# Patient Record
Sex: Female | Born: 1965 | Race: White | Hispanic: No | Marital: Married | Smoking: Never smoker
Health system: Southern US, Community
[De-identification: ages and names within clinical notes are randomized; demographics above are authoritative.]

## PROBLEM LIST (undated history)

## (undated) DIAGNOSIS — K219 Gastro-esophageal reflux disease without esophagitis: Secondary | ICD-10-CM

## (undated) DIAGNOSIS — D649 Anemia, unspecified: Secondary | ICD-10-CM

## (undated) DIAGNOSIS — N939 Abnormal uterine and vaginal bleeding, unspecified: Secondary | ICD-10-CM

## (undated) DIAGNOSIS — R55 Syncope and collapse: Secondary | ICD-10-CM

## (undated) DIAGNOSIS — M542 Cervicalgia: Secondary | ICD-10-CM

## (undated) HISTORY — PX: ABLATION: SHX5711

## (undated) HISTORY — PX: WISDOM TOOTH EXTRACTION: SHX21

## (undated) HISTORY — DX: Abnormal uterine and vaginal bleeding, unspecified: N93.9

## (undated) HISTORY — DX: Syncope and collapse: R55

## (undated) HISTORY — DX: Cervicalgia: M54.2

---

## 2011-09-01 ENCOUNTER — Ambulatory Visit: Payer: Self-pay | Admitting: Family Medicine

## 2014-10-30 LAB — HM PAP SMEAR: HM Pap smear: NEGATIVE

## 2016-03-03 ENCOUNTER — Encounter: Payer: Self-pay | Admitting: Family Medicine

## 2016-03-03 ENCOUNTER — Ambulatory Visit (INDEPENDENT_AMBULATORY_CARE_PROVIDER_SITE_OTHER): Payer: BC Managed Care – PPO | Admitting: Family Medicine

## 2016-03-03 VITALS — BP 134/83 | HR 77 | Temp 97.5°F | Ht 63.5 in | Wt 163.0 lb

## 2016-03-03 DIAGNOSIS — M542 Cervicalgia: Secondary | ICD-10-CM | POA: Diagnosis not present

## 2016-03-03 DIAGNOSIS — Z113 Encounter for screening for infections with a predominantly sexual mode of transmission: Secondary | ICD-10-CM

## 2016-03-03 MED ORDER — IBUPROFEN 600 MG PO TABS: 600.0000 mg | ORAL_TABLET | Freq: Three times a day (TID) | ORAL | Status: DC | PRN

## 2016-03-03 MED ORDER — CYCLOBENZAPRINE HCL 10 MG PO TABS: 10.0000 mg | ORAL_TABLET | Freq: Every day | ORAL | Status: DC

## 2016-03-03 NOTE — Patient Instructions (Addendum)
Cervical Strain and Sprain With Rehab Cervical strain and sprain are injuries that commonly occur with "whiplash" injuries. Whiplash occurs when the neck is forcefully whipped backward or forward, such as during a motor vehicle accident or during contact sports. The muscles, ligaments, tendons, discs, and nerves of the neck are susceptible to injury when this occurs. RISK FACTORS Risk of having a whiplash injury increases if:  Osteoarthritis of the spine.  Situations that make head or neck accidents or trauma more likely.  High-risk sports (football, rugby, wrestling, hockey, auto racing, gymnastics, diving, contact karate, or boxing).  Poor strength and flexibility of the neck.  Previous neck injury.  Poor tackling technique.  Improperly fitted or padded equipment. SYMPTOMS   Pain or stiffness in the front or back of neck or both.  Symptoms may present immediately or up to 24 hours after injury.  Dizziness, headache, nausea, and vomiting.  Muscle spasm with soreness and stiffness in the neck.  Tenderness and swelling at the injury site. PREVENTION  Learn and use proper technique (avoid tackling with the head, spearing, and head-butting; use proper falling techniques to avoid landing on the head).  Warm up and stretch properly before activity.  Maintain physical fitness:  Strength, flexibility, and endurance.  Cardiovascular fitness.  Wear properly fitted and padded protective equipment, such as padded soft collars, for participation in contact sports. PROGNOSIS  Recovery from cervical strain and sprain injuries is dependent on the extent of the injury. These injuries are usually curable in 1 week to 3 months with appropriate treatment.  RELATED COMPLICATIONS   Temporary numbness and weakness may occur if the nerve roots are damaged, and this may persist until the nerve has completely healed.  Chronic pain due to frequent recurrence of symptoms.  Prolonged healing,  especially if activity is resumed too soon (before complete recovery). TREATMENT  Treatment initially involves the use of ice and medication to help reduce pain and inflammation. It is also important to perform strengthening and stretching exercises and modify activities that worsen symptoms so the injury does not get worse. These exercises may be performed at home or with a therapist. For patients who experience severe symptoms, a soft, padded collar may be recommended to be worn around the neck.  Improving your posture may help reduce symptoms. Posture improvement includes pulling your chin and abdomen in while sitting or standing. If you are sitting, sit in a firm chair with your buttocks against the back of the chair. While sleeping, try replacing your pillow with a small towel rolled to 2 inches in diameter, or use a cervical pillow or soft cervical collar. Poor sleeping positions delay healing.  For patients with nerve root damage, which causes numbness or weakness, the use of a cervical traction apparatus may be recommended. Surgery is rarely necessary for these injuries. However, cervical strain and sprains that are present at birth (congenital) may require surgery. MEDICATION   If pain medication is necessary, nonsteroidal anti-inflammatory medications, such as aspirin and ibuprofen, or other minor pain relievers, such as acetaminophen, are often recommended.  Do not take pain medication for 7 days before surgery.  Prescription pain relievers may be given if deemed necessary by your caregiver. Use only as directed and only as much as you need. HEAT AND COLD:   Cold treatment (icing) relieves pain and reduces inflammation. Cold treatment should be applied for 10 to 15 minutes every 2 to 3 hours for inflammation and pain and immediately after any activity that aggravates your  symptoms. Use ice packs or an ice massage.  Heat treatment may be used prior to performing the stretching and  strengthening activities prescribed by your caregiver, physical therapist, or athletic trainer. Use a heat pack or a warm soak. SEEK MEDICAL CARE IF:   Symptoms get worse or do not improve in 2 weeks despite treatment.  New, unexplained symptoms develop (drugs used in treatment may produce side effects). EXERCISES RANGE OF MOTION (ROM) AND STRETCHING EXERCISES - Cervical Strain and Sprain These exercises may help you when beginning to rehabilitate your injury. In order to successfully resolve your symptoms, you must improve your posture. These exercises are designed to help reduce the forward-head and rounded-shoulder posture which contributes to this condition. Your symptoms may resolve with or without further involvement from your physician, physical therapist or athletic trainer. While completing these exercises, remember:   Restoring tissue flexibility helps normal motion to return to the joints. This allows healthier, less painful movement and activity.  An effective stretch should be held for at least 20 seconds, although you may need to begin with shorter hold times for comfort.  A stretch should never be painful. You should only feel a gentle lengthening or release in the stretched tissue. STRETCH- Axial Extensors  Lie on your back on the floor. You may bend your knees for comfort. Place a rolled-up hand towel or dish towel, about 2 inches in diameter, under the part of your head that makes contact with the floor.  Gently tuck your chin, as if trying to make a "double chin," until you feel a gentle stretch at the base of your head.  Hold _____30_____ seconds. Repeat ____2______ times. Complete this exercise ____2 ______ times per day.  STRETCH - Axial Extension   Stand or sit on a firm surface. Assume a good posture: chest up, shoulders drawn back, abdominal muscles slightly tense, knees unlocked (if standing) and feet hip width apart.  Slowly retract your chin so your head slides  back and your chin slightly lowers. Continue to look straight ahead.  You should feel a gentle stretch in the back of your head. Be certain not to feel an aggressive stretch since this can cause headaches later.  Hold for _____30_____ seconds. Repeat ____2______ times. Complete this exercise ____2______ times per day. STRETCH - Cervical Side Bend   Stand or sit on a firm surface. Assume a good posture: chest up, shoulders drawn back, abdominal muscles slightly tense, knees unlocked (if standing) and feet hip width apart.  Without letting your nose or shoulders move, slowly tip your right / left ear to your shoulder until your feel a gentle stretch in the muscles on the opposite side of your neck.  Hold ______30____ seconds. Repeat ____2______ times. Complete this exercise ____2______ times per day. STRETCH - Cervical Rotators   Stand or sit on a firm surface. Assume a good posture: chest up, shoulders drawn back, abdominal muscles slightly tense, knees unlocked (if standing) and feet hip width apart.  Keeping your eyes level with the ground, slowly turn your head until you feel a gentle stretch along the back and opposite side of your neck.  Hold ____30______ seconds. Repeat ___2_______ times. Complete this exercise ____2______ times per day. RANGE OF MOTION - Neck Circles   Stand or sit on a firm surface. Assume a good posture: chest up, shoulders drawn back, abdominal muscles slightly tense, knees unlocked (if standing) and feet hip width apart.  Gently roll your head down and around from the  back of one shoulder to the back of the other. The motion should never be forced or painful.  Repeat the motion 10-20 times, or until you feel the neck muscles relax and loosen. Repeat _____2_____ times. Complete the exercise _____2_____ times per day. STRENGTHENING EXERCISES - Cervical Strain and Sprain These exercises may help you when beginning to rehabilitate your injury. They may resolve  your symptoms with or without further involvement from your physician, physical therapist, or athletic trainer. While completing these exercises, remember:   Muscles can gain both the endurance and the strength needed for everyday activities through controlled exercises.  Complete these exercises as instructed by your physician, physical therapist, or athletic trainer. Progress the resistance and repetitions only as guided.  You may experience muscle soreness or fatigue, but the pain or discomfort you are trying to eliminate should never worsen during these exercises. If this pain does worsen, stop and make certain you are following the directions exactly. If the pain is still present after adjustments, discontinue the exercise until you can discuss the trouble with your clinician. STRENGTH - Cervical Flexors, Isometric  Face a wall, standing about 6 inches away. Place a small pillow, a ball about 6-8 inches in diameter, or a folded towel between your forehead and the wall.  Slightly tuck your chin and gently push your forehead into the soft object. Push only with mild to moderate intensity, building up tension gradually. Keep your jaw and forehead relaxed.  Hold 10 to 20 seconds. Keep your breathing relaxed.  Release the tension slowly. Relax your neck muscles completely before you start the next repetition. Repeat _____2_____ times. Complete this exercise _____2_____ times per day. STRENGTH- Cervical Lateral Flexors, Isometric   Stand about 6 inches away from a wall. Place a small pillow, a ball about 6-8 inches in diameter, or a folded towel between the side of your head and the wall.  Slightly tuck your chin and gently tilt your head into the soft object. Push only with mild to moderate intensity, building up tension gradually. Keep your jaw and forehead relaxed.  Hold 10 to 20 seconds. Keep your breathing relaxed.  Release the tension slowly. Relax your neck muscles completely before  you start the next repetition. Repeat ______2____ times. Complete this exercise _____2_____ times per day. STRENGTH - Cervical Extensors, Isometric   Stand about 6 inches away from a wall. Place a small pillow, a ball about 6-8 inches in diameter, or a folded towel between the back of your head and the wall.  Slightly tuck your chin and gently tilt your head back into the soft object. Push only with mild to moderate intensity, building up tension gradually. Keep your jaw and forehead relaxed.  Hold 10 to 20 seconds. Keep your breathing relaxed.  Release the tension slowly. Relax your neck muscles completely before you start the next repetition. Repeat _____2_____ times. Complete this exercise _____2_____ times per day. POSTURE AND BODY MECHANICS CONSIDERATIONS - Cervical Strain and Sprain Keeping correct posture when sitting, standing or completing your activities will reduce the stress put on different body tissues, allowing injured tissues a chance to heal and limiting painful experiences. The following are general guidelines for improved posture. Your physician or physical therapist will provide you with any instructions specific to your needs. While reading these guidelines, remember:  The exercises prescribed by your provider will help you have the flexibility and strength to maintain correct postures.  The correct posture provides the optimal environment for your joints to  work. All of your joints have less wear and tear when properly supported by a spine with good posture. This means you will experience a healthier, less painful body.  Correct posture must be practiced with all of your activities, especially prolonged sitting and standing. Correct posture is as important when doing repetitive low-stress activities (typing) as it is when doing a single heavy-load activity (lifting). PROLONGED STANDING WHILE SLIGHTLY LEANING FORWARD When completing a task that requires you to lean forward  while standing in one place for a long time, place either foot up on a stationary 2- to 4-inch high object to help maintain the best posture. When both feet are on the ground, the low back tends to lose its slight inward curve. If this curve flattens (or becomes too large), then the back and your other joints will experience too much stress, fatigue more quickly, and can cause pain.  RESTING POSITIONS Consider which positions are most painful for you when choosing a resting position. If you have pain with flexion-based activities (sitting, bending, stooping, squatting), choose a position that allows you to rest in a less flexed posture. You would want to avoid curling into a fetal position on your side. If your pain worsens with extension-based activities (prolonged standing, working overhead), avoid resting in an extended position such as sleeping on your stomach. Most people will find more comfort when they rest with their spine in a more neutral position, neither too rounded nor too arched. Lying on a non-sagging bed on your side with a pillow between your knees, or on your back with a pillow under your knees will often provide some relief. Keep in mind, being in any one position for a prolonged period of time, no matter how correct your posture, can still lead to stiffness. WALKING Walk with an upright posture. Your ears, shoulders, and hips should all line up. OFFICE WORK When working at a desk, create an environment that supports good, upright posture. Without extra support, muscles fatigue and lead to excessive strain on joints and other tissues. CHAIR:  A chair should be able to slide under your desk when your back makes contact with the back of the chair. This allows you to work closely.  The chair's height should allow your eyes to be level with the upper part of your monitor and your hands to be slightly lower than your elbows.  Body position:  Your feet should make contact with the floor.  If this is not possible, use a foot rest.  Keep your ears over your shoulders. This will reduce stress on your neck and low back.   This information is not intended to replace advice given to you by your health care provider. Make sure you discuss any questions you have with your health care provider.   Document Released: 11/23/2005 Document Revised: 12/14/2014 Document Reviewed: 03/07/2009 Elsevier Interactive Patient Education [employer].

## 2016-03-03 NOTE — Progress Notes (Signed)
BP 134/83 mmHg  Pulse 77  Temp(Src) 97.5 F (36.4 C)  Ht 5' 3.5" (1.613 m)  Wt 163 lb (73.936 kg)  BMI 28.42 kg/m2  SpO2 100%  LMP 02/26/2016 (Approximate)   Subjective:    Patient ID: Julie Patel, female    DOB: 21-May-1966, 50 y.o.   MRN: [id number]  HPI: Julie Patel is a 50 y.o. female  Chief Complaint  Patient presents with  . Neck Pain    X 1 week, no known injury   NECK PAIN- started about a week ago, has seen chiropractor 2x, she notes that it is a dull aches and an aching gradual on the whole neck, severe. She has been taking advil, which hasn't helped. It is worse with looking over her shoulder She has never had anything like this before. She went to see chiropractor and had an adjustment x1. She is not sure how she liked it. Has been feeling better since her adjustment Status: fluctuating Treatments attempted: chiropractor, rest and ibuprofen, heat and ice Relief with NSAIDs?:  significant Location: whole neck in the strap muscles Duration: 1 week Severity: severe Quality: tingling, burning, throbbing, depends Frequency: constant Radiation: headache Aggravating factors: moving her head around Alleviating factors: advil, chiropractry Weakness:  no Paresthesias / decreased sensation:  no  Fevers:  no  Relevant past medical, surgical, family and social history reviewed and updated as indicated. Interim medical history since our last visit reviewed. Allergies and medications reviewed and updated.  Review of Systems  Constitutional: Negative.   Respiratory: Negative.   Cardiovascular: Negative.   Musculoskeletal: Positive for myalgias, neck pain and neck stiffness. Negative for back pain, joint swelling, arthralgias and gait problem.  Psychiatric/Behavioral: Negative.     Per HPI unless specifically indicated above     Objective:    BP 134/83 mmHg  Pulse 77  Temp(Src) 97.5 F (36.4 C)  Ht 5' 3.5" (1.613 m)  Wt 163 lb (73.936 kg)  BMI 28.42  kg/m2  SpO2 100%  LMP 02/26/2016 (Approximate)  Wt Readings from Last 3 Encounters:  03/03/16 163 lb (73.936 kg)  10/30/14 160 lb (72.576 kg)    Physical Exam  Constitutional: She is oriented to person, place, and time. She appears well-developed and well-nourished. No distress.  HENT:  Head: Normocephalic and atraumatic.  Right Ear: Hearing normal.  Left Ear: Hearing normal.  Nose: Nose normal.  Eyes: Conjunctivae and lids are normal. Right eye exhibits no discharge. Left eye exhibits no discharge. No scleral icterus.  Cardiovascular: Normal rate, regular rhythm, normal heart sounds and intact distal pulses.  Exam reveals no gallop and no friction rub.   No murmur heard. Pulmonary/Chest: Effort normal and breath sounds normal. No respiratory distress. She has no wheezes. She has no rales. She exhibits no tenderness.  Neurological: She is alert and oriented to person, place, and time.  Skin: Skin is warm, dry and intact. No rash noted. No erythema. No pallor.  Psychiatric: She has a normal mood and affect. Her speech is normal and behavior is normal. Judgment and thought content normal. Cognition and memory are normal.  Nursing note and vitals reviewed. Neck Exam:    Tenderness to Palpation: yes    Midline cervical spine: no    Paraspinal neck musculature: yes    Trapezius: yes    Sternocleidomastoid: yes     Range of Motion:     Flexion: Decreased    Extension: Decreased    Lateral rotation: Decreased    Lateral  bending: Decreased     Neuro Examination: Upper extremity DTRs normal & symmetric.  Strength and sensation intact.      Special Tests:     Spurling test: negative   Results for orders placed or performed in visit on 03/03/16  HM PAP SMEAR  Result Value Ref Range   HM Pap smear Ascus, Negative HPV       Assessment & Plan:   Problem List Items Addressed This Visit    None    Visit Diagnoses    Neck pain    -  Primary    Seems to be due to muscle spasm. Will  start exercises, ibuprofen and flexeril. Recheck 2 weeks, if not better, consider PT. Continue heat.    Screening for STD (sexually transmitted disease)        Drawn today. Await results.     Relevant Orders    HIV antibody        Follow up plan: Return in about 2 weeks (around 03/17/2016) for follow up neck.

## 2016-03-04 ENCOUNTER — Encounter: Payer: Self-pay | Admitting: Family Medicine

## 2016-03-04 LAB — HIV ANTIBODY (ROUTINE TESTING W REFLEX): HIV Screen 4th Generation wRfx: NONREACTIVE

## 2016-03-17 ENCOUNTER — Encounter: Payer: Self-pay | Admitting: Family Medicine

## 2016-03-17 ENCOUNTER — Ambulatory Visit (INDEPENDENT_AMBULATORY_CARE_PROVIDER_SITE_OTHER): Payer: BC Managed Care – PPO | Admitting: Family Medicine

## 2016-03-17 VITALS — BP 130/84 | HR 77 | Temp 98.9°F | Ht 64.3 in | Wt 161.0 lb

## 2016-03-17 DIAGNOSIS — M542 Cervicalgia: Secondary | ICD-10-CM

## 2016-03-17 DIAGNOSIS — M722 Plantar fascial fibromatosis: Secondary | ICD-10-CM

## 2016-03-17 DIAGNOSIS — R413 Other amnesia: Secondary | ICD-10-CM | POA: Diagnosis not present

## 2016-03-17 NOTE — Patient Instructions (Signed)

## 2016-03-17 NOTE — Progress Notes (Signed)
BP 130/84 mmHg  Pulse 77  Temp(Src) 98.9 F (37.2 C)  Ht 5' 4.3" (1.633 m)  Wt 161 lb (73.029 kg)  BMI 27.39 kg/m2  SpO2 97%  LMP 02/26/2016 (Approximate)   Subjective:    Patient ID: Julie Patel, female    DOB: 11-Nov-1966, 50 y.o.   MRN: [id number]  HPI: Julie Patel is a 50 y.o. female  Chief Complaint  Patient presents with  . Neck Pain    Patient states that her neck feels a lot better  . 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 She is just concerned about the symothoms she has had   Neck feeling much better. Not entirely gone. Now noticing that it's a little different. Occasionally pops. Hurts when she does a lot of work or does things that she is not used to doing.   FOOT PAIN Duration: since last summer Involved foot: right Mechanism of injury: unknown Location: bottom of her foot Onset: gradual  Severity: moderate  Quality:  pressure-like and stabbing Frequency: intermittent Radiation: yes, into the back of her leg Aggravating factors: first step of the day  Alleviating factors: nothing  Status: worse Treatments attempted: rest  Relief with NSAIDs?:  No NSAIDs Taken Weakness with weight bearing or walking: no Morning stiffness: yes Swelling: no Redness: no Bruising: no Paresthesias / decreased sensation: no  Fevers:no  Concerned about her memory. Notes that her vocabulary has decreased. Has some word finding issues. Notes that it was associated with her cycle, but now harder to link as the cycle is less normal. Has some trouble with remembering phone calls and other things like that. Thinks that it may be increased stress with all the activities that she has to make sure she is monitor and take care of. Her mom has alzheimer's. She is not sure how old her mom was when she started to have issues.     Relevant past medical, surgical, family and social history reviewed and updated as indicated. Interim medical history since our last visit reviewed. Allergies and medications reviewed and updated.  Review of Systems  Constitutional: Negative.   Respiratory: Negative.   Cardiovascular: Negative.   Musculoskeletal: Positive for myalgias. Negative for back pain, joint swelling, arthralgias, gait problem, neck pain and neck stiffness.  Psychiatric/Behavioral: Negative for suicidal ideas, hallucinations, behavioral problems, confusion, sleep disturbance, self-injury, dysphoric mood, decreased concentration and agitation. The patient is not nervous/anxious and is not hyperactive.     Per HPI unless specifically indicated above     Objective:    BP 130/84 mmHg  Pulse 77  Temp(Src) 98.9 F (37.2 C)  Ht 5' 4.3" (1.633 m)  Wt 161 lb (73.029 kg)  BMI 27.39 kg/m2  SpO2 97%  LMP 02/26/2016 (Approximate)  Wt Readings from Last 3 Encounters:  03/17/16 161 lb (73.029 kg)  03/03/16 163 lb (73.936 kg)  10/30/14 160 lb (72.576 kg)    Physical Exam  Constitutional: She is oriented to person, place, and time. She appears well-developed and well-nourished. No distress.  HENT:  Head: Normocephalic and atraumatic.  Right Ear: Hearing and external ear normal.  Left Ear: Hearing and external ear normal.  Nose: Nose normal.  Eyes: Conjunctivae, EOM and lids are normal. Pupils are equal, round, and reactive to light. Right eye exhibits no discharge. Left eye exhibits  no discharge. No scleral icterus.  Neck: Normal range of motion. Neck supple. No JVD present. No tracheal deviation present. No thyromegaly present.  Pulmonary/Chest: Effort normal. No stridor. No respiratory distress.  Musculoskeletal: Normal range of motion. She exhibits tenderness (to the plantar fascia on the R side).  Lymphadenopathy:    She has no cervical adenopathy.  Neurological: She is alert and oriented to person, place,  and time.  Skin: Skin is warm, dry and intact. No rash noted. She is not diaphoretic. No erythema. No pallor.  Psychiatric: She has a normal mood and affect. Her speech is normal and behavior is normal. Judgment and thought content normal. Cognition and memory are normal.    Results for orders placed or performed in visit on 03/17/16  CBC with Differential/Platelet  Result Value Ref Range   WBC 6.6 3.4 - 10.8 x10E3/uL   RBC 4.74 3.77 - 5.28 x10E6/uL   Hemoglobin 13.4 11.1 - 15.9 g/dL   Hematocrit 39.9 34.0 - 46.6 %   MCV 84 79 - 97 fL   MCH 28.3 26.6 - 33.0 pg   MCHC 33.6 31.5 - 35.7 g/dL   RDW 14.0 12.3 - 15.4 %   Platelets 257 150 - 379 x10E3/uL   Neutrophils 64 %   Lymphs 28 %   Monocytes 6 %   Eos 2 %   Basos 0 %   Neutrophils Absolute 4.2 1.4 - 7.0 x10E3/uL   Lymphocytes Absolute 1.9 0.7 - 3.1 x10E3/uL   Monocytes Absolute 0.4 0.1 - 0.9 x10E3/uL   EOS (ABSOLUTE) 0.1 0.0 - 0.4 x10E3/uL   Basophils Absolute 0.0 0.0 - 0.2 x10E3/uL   Immature Granulocytes 0 %   Immature Grans (Abs) 0.0 0.0 - 0.1 x10E3/uL  Thyroid Panel With TSH  Result Value Ref Range   TSH 1.660 0.450 - 4.500 uIU/mL   T4, Total 8.2 4.5 - 12.0 ug/dL   T3 Uptake Ratio 23 (L) 24 - 39 %   Free Thyroxine Index 1.9 1.2 - 4.9  VITAMIN D 25 Hydroxy (Vit-D Deficiency, Fractures)  Result Value Ref Range   Vit D, 25-Hydroxy 45.8 30.0 - 100.0 ng/mL  B12 and Folate Panel  Result Value Ref Range   Vitamin B-12 706 211 - 946 pg/mL   Folate 15.0 >3.0 ng/mL  Comprehensive metabolic panel  Result Value Ref Range   Glucose 86 65 - 99 mg/dL   BUN 13 6 - 24 mg/dL   Creatinine, Ser 0.92 0.57 - 1.00 mg/dL   GFR calc non Af Amer 74 >59 mL/min/1.73   GFR calc Af Amer 86 >59 mL/min/1.73   BUN/Creatinine Ratio 14 9 - 23   Sodium 140 134 - 144 mmol/L   Potassium 4.8 3.5 - 5.2 mmol/L   Chloride 100 96 - 106 mmol/L   CO2 24 18 - 29 mmol/L   Calcium 9.5 8.7 - 10.2 mg/dL   Total Protein 7.3 6.0 - 8.5 g/dL   Albumin 4.5 3.5  - 5.5 g/dL   Globulin, Total 2.8 1.5 - 4.5 g/dL   Albumin/Globulin Ratio 1.6 1.2 - 2.2   Bilirubin Total 0.8 0.0 - 1.2 mg/dL   Alkaline Phosphatase 66 39 - 117 IU/L   AST 19 0 - 40 IU/L   ALT 13 0 - 32 IU/L      Assessment & Plan:   Problem List Items Addressed This Visit    None    Visit Diagnoses    Plantar fasciitis of right foot    -  Primary    Brace Rx given. Work on stretches, information given, discussed freezing a water bottle. Let us know if not getting better or getting worse.     Neck pain        Resolved. Continue exercises. Call if getting worse again.     Memory loss        MMSE 30 today. Likely stress and too much on her plate. Will check labs.Continue to monitor, let us know if getting worse and will consider neurology referral.     Relevant Orders    CBC with Differential/Platelet (Completed)    Thyroid Panel With TSH (Completed)    VITAMIN D 25 Hydroxy (Vit-D Deficiency, Fractures) (Completed)    B12 and Folate Panel (Completed)    Comprehensive metabolic panel (Completed)        Follow up plan: Return if symptoms worsen or fail to improve.

## 2016-03-18 ENCOUNTER — Encounter: Payer: Self-pay | Admitting: Family Medicine

## 2016-03-18 LAB — COMPREHENSIVE METABOLIC PANEL
A/G RATIO: 1.6 (ref 1.2–2.2)
ALK PHOS: 66 IU/L (ref 39–117)
ALT: 13 IU/L (ref 0–32)
AST: 19 IU/L (ref 0–40)
Albumin: 4.5 g/dL (ref 3.5–5.5)
BILIRUBIN TOTAL: 0.8 mg/dL (ref 0.0–1.2)
BUN/Creatinine Ratio: 14 (ref 9–23)
BUN: 13 mg/dL (ref 6–24)
CO2: 24 mmol/L (ref 18–29)
Calcium: 9.5 mg/dL (ref 8.7–10.2)
Chloride: 100 mmol/L (ref 96–106)
Creatinine, Ser: 0.92 mg/dL (ref 0.57–1.00)
GFR calc Af Amer: 86 mL/min/{1.73_m2} (ref >59)
GFR, EST NON AFRICAN AMERICAN: 74 mL/min/{1.73_m2} (ref >59)
GLOBULIN, TOTAL: 2.8 g/dL (ref 1.5–4.5)
Glucose: 86 mg/dL (ref 65–99)
POTASSIUM: 4.8 mmol/L (ref 3.5–5.2)
SODIUM: 140 mmol/L (ref 134–144)
Total Protein: 7.3 g/dL (ref 6.0–8.5)

## 2016-03-18 LAB — CBC WITH DIFFERENTIAL/PLATELET
BASOS: 0 %
Basophils Absolute: 0 10*3/uL (ref 0.0–0.2)
EOS (ABSOLUTE): 0.1 10*3/uL (ref 0.0–0.4)
EOS: 2 %
HEMATOCRIT: 39.9 % (ref 34.0–46.6)
HEMOGLOBIN: 13.4 g/dL (ref 11.1–15.9)
IMMATURE GRANULOCYTES: 0 %
Immature Grans (Abs): 0 10*3/uL (ref 0.0–0.1)
Lymphocytes Absolute: 1.9 10*3/uL (ref 0.7–3.1)
Lymphs: 28 %
MCH: 28.3 pg (ref 26.6–33.0)
MCHC: 33.6 g/dL (ref 31.5–35.7)
MCV: 84 fL (ref 79–97)
MONOCYTES: 6 %
Monocytes Absolute: 0.4 10*3/uL (ref 0.1–0.9)
NEUTROS PCT: 64 %
Neutrophils Absolute: 4.2 10*3/uL (ref 1.4–7.0)
Platelets: 257 10*3/uL (ref 150–379)
RBC: 4.74 x10E6/uL (ref 3.77–5.28)
RDW: 14 % (ref 12.3–15.4)
WBC: 6.6 10*3/uL (ref 3.4–10.8)

## 2016-03-18 LAB — VITAMIN D 25 HYDROXY (VIT D DEFICIENCY, FRACTURES): VIT D 25 HYDROXY: 45.8 ng/mL (ref 30.0–100.0)

## 2016-03-18 LAB — THYROID PANEL WITH TSH
FREE THYROXINE INDEX: 1.9 (ref 1.2–4.9)
T3 UPTAKE RATIO: 23 % — AB (ref 24–39)
T4 TOTAL: 8.2 ug/dL (ref 4.5–12.0)
TSH: 1.66 u[IU]/mL (ref 0.450–4.500)

## 2016-03-18 LAB — B12 AND FOLATE PANEL
Folate: 15 ng/mL (ref >3.0)
VITAMIN B 12: 706 pg/mL (ref 211–946)

## 2016-05-25 ENCOUNTER — Ambulatory Visit (INDEPENDENT_AMBULATORY_CARE_PROVIDER_SITE_OTHER): Payer: BC Managed Care – PPO | Admitting: Family Medicine

## 2016-05-25 ENCOUNTER — Encounter: Payer: Self-pay | Admitting: Family Medicine

## 2016-05-25 VITALS — BP 120/75 | HR 71 | Temp 97.9°F | Ht 64.0 in | Wt 164.0 lb

## 2016-05-25 DIAGNOSIS — R413 Other amnesia: Secondary | ICD-10-CM

## 2016-05-25 DIAGNOSIS — M722 Plantar fascial fibromatosis: Secondary | ICD-10-CM | POA: Diagnosis not present

## 2016-05-25 NOTE — Progress Notes (Signed)
BP 120/75 mmHg  Pulse 71  Temp(Src) 97.9 F (36.6 C)  Ht 5\' 4"  (1.626 m)  Wt 164 lb (74.39 kg)  BMI 28.14 kg/m2  SpO2 98%  LMP  (LMP Unknown)   Subjective:    Patient ID: Julie Patel, female    DOB: [birth year], 50 y.o.   MRN: [id number]  HPI: Julie Patel is a 50 y.o. female  Chief Complaint  Patient presents with  . Foot Pain    right   . Memory Loss   Foot is doing so much better. Still has a little bit of pain but doing much better. She has been doing her stretches. She is feeling better. No other concerns about this.   Memory is still there. Hasn't had much time to worry about it. She is aware of it. Still having issues with word finding. Has been trying to make decisions about possible career change and going back to school and is very anxious about this. She is still very concerned about her memory given that her Mom has dementia. She is afraid that she is going to get it too. She would like to see neurology to discuss with them and would like to see someone other than who her Mom sees.   She is otherwise doing well with no other concerns or complaints at this time.   Relevant past medical, surgical, family and social history reviewed and updated as indicated. Interim medical history since our last visit reviewed. Allergies and medications reviewed and updated.  Review of Systems  Constitutional: Negative.   Respiratory: Negative.   Cardiovascular: Negative.   Psychiatric/Behavioral: Negative.     Per HPI unless specifically indicated above     Objective:    BP 120/75 mmHg  Pulse 71  Temp(Src) 97.9 F (36.6 C)  Ht 5\' 4"  (1.626 m)  Wt 164 lb (74.39 kg)  BMI 28.14 kg/m2  SpO2 98%  LMP  (LMP Unknown)  Wt Readings from Last 3 Encounters:  05/25/16 164 lb (74.39 kg)  03/17/16 161 lb (73.029 kg)  03/03/16 163 lb (73.936 kg)    Physical Exam  Constitutional: She is oriented to person, place, and time. She appears well-developed and well-nourished.  No distress.  HENT:  Head: Normocephalic and atraumatic.  Right Ear: Hearing normal.  Left Ear: Hearing normal.  Nose: Nose normal.  Eyes: Conjunctivae and lids are normal. Right eye exhibits no discharge. Left eye exhibits no discharge. No scleral icterus.  Cardiovascular: Normal rate, regular rhythm, normal heart sounds and intact distal pulses.  Exam reveals no gallop and no friction rub.   No murmur heard. Pulmonary/Chest: Effort normal and breath sounds normal. No respiratory distress. She has no wheezes. She has no rales. She exhibits no tenderness.  Musculoskeletal: Normal range of motion.  Neurological: She is alert and oriented to person, place, and time.  Skin: Skin is warm, dry and intact. No rash noted. No erythema. No pallor.  Psychiatric: Her speech is normal and behavior is normal. Judgment and thought content normal. Her mood appears anxious. Cognition and memory are normal.  Nursing note and vitals reviewed.   Results for orders placed or performed in visit on 03/17/16  CBC with Differential/Platelet  Result Value Ref Range   WBC 6.6 3.4 - 10.8 x10E3/uL   RBC 4.74 3.77 - 5.28 x10E6/uL   Hemoglobin 13.4 11.1 - 15.9 g/dL   Hematocrit 39.9 34.0 - 46.6 %   MCV 84 79 - 97 fL   MCH 28.3  26.6 - 33.0 pg   MCHC 33.6 31.5 - 35.7 g/dL   RDW 14.0 12.3 - 15.4 %   Platelets 257 150 - 379 x10E3/uL   Neutrophils 64 %   Lymphs 28 %   Monocytes 6 %   Eos 2 %   Basos 0 %   Neutrophils Absolute 4.2 1.4 - 7.0 x10E3/uL   Lymphocytes Absolute 1.9 0.7 - 3.1 x10E3/uL   Monocytes Absolute 0.4 0.1 - 0.9 x10E3/uL   EOS (ABSOLUTE) 0.1 0.0 - 0.4 x10E3/uL   Basophils Absolute 0.0 0.0 - 0.2 x10E3/uL   Immature Granulocytes 0 %   Immature Grans (Abs) 0.0 0.0 - 0.1 x10E3/uL  Thyroid Panel With TSH  Result Value Ref Range   TSH 1.660 0.450 - 4.500 uIU/mL   T4, Total 8.2 4.5 - 12.0 ug/dL   T3 Uptake Ratio 23 (L) 24 - 39 %   Free Thyroxine Index 1.9 1.2 - 4.9  VITAMIN D 25 Hydroxy (Vit-D  Deficiency, Fractures)  Result Value Ref Range   Vit D, 25-Hydroxy 45.8 30.0 - 100.0 ng/mL  B12 and Folate Panel  Result Value Ref Range   Vitamin B-12 706 211 - 946 pg/mL   Folate 15.0 >3.0 ng/mL  Comprehensive metabolic panel  Result Value Ref Range   Glucose 86 65 - 99 mg/dL   BUN 13 6 - 24 mg/dL   Creatinine, Ser 0.92 0.57 - 1.00 mg/dL   GFR calc non Af Amer 74 >59 mL/min/1.73   GFR calc Af Amer 86 >59 mL/min/1.73   BUN/Creatinine Ratio 14 9 - 23   Sodium 140 134 - 144 mmol/L   Potassium 4.8 3.5 - 5.2 mmol/L   Chloride 100 96 - 106 mmol/L   CO2 24 18 - 29 mmol/L   Calcium 9.5 8.7 - 10.2 mg/dL   Total Protein 7.3 6.0 - 8.5 g/dL   Albumin 4.5 3.5 - 5.5 g/dL   Globulin, Total 2.8 1.5 - 4.5 g/dL   Albumin/Globulin Ratio 1.6 1.2 - 2.2   Bilirubin Total 0.8 0.0 - 1.2 mg/dL   Alkaline Phosphatase 66 39 - 117 IU/L   AST 19 0 - 40 IU/L   ALT 13 0 - 32 IU/L      Assessment & Plan:   Problem List Items Addressed This Visit    None    Visit Diagnoses    Memory loss    -  Primary    Again likely due to stress, but she is very concerned. Labs normal last visit. Would like to see neurology. Referral generated today. MMSE 30 last visit.     Relevant Orders    Ambulatory referral to Neurology    Plantar fasciitis of right foot        Improved. Continue stretches. Call with concerns. Keep stretching during flip-flop season.        Follow up plan: Return in about 6 months (around 11/24/2016) for physical.

## 2016-06-29 ENCOUNTER — Ambulatory Visit: Payer: Self-pay | Admitting: Neurology

## 2016-09-14 ENCOUNTER — Ambulatory Visit (INDEPENDENT_AMBULATORY_CARE_PROVIDER_SITE_OTHER): Payer: BC Managed Care – PPO | Admitting: Family Medicine

## 2016-09-14 ENCOUNTER — Encounter: Payer: Self-pay | Admitting: Family Medicine

## 2016-09-14 VITALS — BP 120/82 | HR 70 | Temp 97.8°F | Wt 169.0 lb

## 2016-09-14 DIAGNOSIS — N390 Urinary tract infection, site not specified: Secondary | ICD-10-CM

## 2016-09-14 DIAGNOSIS — Z23 Encounter for immunization: Secondary | ICD-10-CM | POA: Diagnosis not present

## 2016-09-14 MED ORDER — PHENAZOPYRIDINE HCL 200 MG PO TABS: 200.0000 mg | ORAL_TABLET | Freq: Three times a day (TID) | ORAL | 0 refills | Status: DC | PRN

## 2016-09-14 MED ORDER — SULFAMETHOXAZOLE-TRIMETHOPRIM 800-160 MG PO TABS: 1.0000 | ORAL_TABLET | Freq: Two times a day (BID) | ORAL | 0 refills | Status: DC

## 2016-09-14 NOTE — Progress Notes (Signed)
   BP 120/82   Pulse 70   Temp 97.8 F (36.6 C)   Wt 169 lb (76.7 kg)   LMP  (LMP Unknown) Comment: very irregular  SpO2 100%   BMI 29.01 kg/m    Subjective:    Patient ID: Julie Patel, female    DOB: 1966/05/06, 50 y.o.   MRN: [id number]  HPI: Julie Patel is a 50 y.o. female  Chief Complaint  Patient presents with  . Urinary Tract Infection    started yesterday. hurts to pee, frequency.    Patient presents with a 1 day history of urinary frequency, urgency, and dysuria. Denies fever, chills, back pain, N/V. Has not tried anything OTC at this time. Has had sporadic UTIs in the past and states this feels consistent with how they felt so she wanted to come in right away.   Relevant past medical, surgical, family and social history reviewed and updated as indicated. Interim medical history since our last visit reviewed. Allergies and medications reviewed and updated.  Review of Systems  Constitutional: Negative.   HENT: Negative.   Respiratory: Negative.   Cardiovascular: Negative.   Gastrointestinal: Negative.   Genitourinary: Positive for dysuria, frequency and urgency.  Musculoskeletal: Negative.   Skin: Negative.   Neurological: Negative.   Psychiatric/Behavioral: Negative.     Per HPI unless specifically indicated above     Objective:    BP 120/82   Pulse 70   Temp 97.8 F (36.6 C)   Wt 169 lb (76.7 kg)   LMP  (LMP Unknown) Comment: very irregular  SpO2 100%   BMI 29.01 kg/m   Wt Readings from Last 3 Encounters:  09/14/16 169 lb (76.7 kg)  05/25/16 164 lb (74.4 kg)  03/17/16 161 lb (73 kg)    Physical Exam  Constitutional: She is oriented to person, place, and time. She appears well-developed and well-nourished. No distress.  HENT:  Head: Atraumatic.  Eyes: Conjunctivae are normal. No scleral icterus.  Neck: Normal range of motion. Neck supple.  Cardiovascular: Normal rate and normal heart sounds.   Pulmonary/Chest: Effort normal and  breath sounds normal.  Abdominal: Soft. Bowel sounds are normal. She exhibits no distension. There is no tenderness.  Musculoskeletal: Normal range of motion.  No CVA tenderness b/l  Neurological: She is alert and oriented to person, place, and time.  Skin: Skin is warm and dry.  Psychiatric: She has a normal mood and affect. Her behavior is normal.      Assessment & Plan:   Problem List Items Addressed This Visit    None    Visit Diagnoses    Acute lower UTI    -  Primary   Bactrim and AZO sent. Encouraged good water intake, cranberry juice, and a probiotic. Await cx results   Relevant Medications   sulfamethoxazole-trimethoprim (BACTRIM DS,SEPTRA DS) 800-160 MG tablet   phenazopyridine (PYRIDIUM) 200 MG tablet   Other Relevant Orders   UA/M w/rflx Culture, Routine (Completed)   Needs flu shot       Encounter for immunization       Relevant Orders   Flu Vaccine QUAD 36+ mos IM (Completed)       Follow up plan: Return if symptoms worsen or fail to improve.

## 2016-09-14 NOTE — Patient Instructions (Signed)
Follow up as needed

## 2016-09-16 LAB — MICROSCOPIC EXAMINATION

## 2016-09-16 LAB — UA/M W/RFLX CULTURE, ROUTINE
BILIRUBIN UA: NEGATIVE
Glucose, UA: NEGATIVE
KETONES UA: NEGATIVE
NITRITE UA: POSITIVE — AB
Specific Gravity, UA: 1.01 (ref 1.005–1.030)
UUROB: 0.2 mg/dL (ref 0.2–1.0)
pH, UA: 6 (ref 5.0–7.5)

## 2016-09-16 LAB — URINE CULTURE, REFLEX

## 2016-10-21 ENCOUNTER — Encounter: Payer: Self-pay | Admitting: Family Medicine

## 2016-10-21 ENCOUNTER — Ambulatory Visit (INDEPENDENT_AMBULATORY_CARE_PROVIDER_SITE_OTHER): Payer: BC Managed Care – PPO | Admitting: Family Medicine

## 2016-10-21 VITALS — BP 117/79 | HR 69 | Temp 97.9°F | Ht 65.5 in | Wt 169.2 lb

## 2016-10-21 DIAGNOSIS — N939 Abnormal uterine and vaginal bleeding, unspecified: Secondary | ICD-10-CM | POA: Diagnosis not present

## 2016-10-21 DIAGNOSIS — N898 Other specified noninflammatory disorders of vagina: Secondary | ICD-10-CM

## 2016-10-21 DIAGNOSIS — H9193 Unspecified hearing loss, bilateral: Secondary | ICD-10-CM | POA: Diagnosis not present

## 2016-10-21 LAB — UA/M W/RFLX CULTURE, ROUTINE
Bilirubin, UA: NEGATIVE
Glucose, UA: NEGATIVE
KETONES UA: NEGATIVE
Leukocytes, UA: NEGATIVE
NITRITE UA: NEGATIVE
PH UA: 7 (ref 5.0–7.5)
Protein, UA: NEGATIVE
RBC UA: NEGATIVE
SPEC GRAV UA: 1.02 (ref 1.005–1.030)
UUROB: 0.2 mg/dL (ref 0.2–1.0)

## 2016-10-21 MED ORDER — FLUCONAZOLE 150 MG PO TABS: 150.0000 mg | ORAL_TABLET | Freq: Once | ORAL | 0 refills | Status: AC

## 2016-10-21 NOTE — Progress Notes (Signed)
BP 117/79 (BP Location: Right Arm, Patient Position: Sitting, Cuff Size: Normal)   Pulse 69   Temp 97.9 F (36.6 C)   Ht 5' 5.5" (1.664 m)   Wt 169 lb 3.2 oz (76.7 kg)   LMP 09/25/2016 (Approximate)   SpO2 100%   BMI 27.73 kg/m    Subjective:    Patient ID: Julie Patel, female    DOB: 11-19-1966, 50 y.o.   MRN: [id number]  HPI: Julie Patel is a 50 y.o. female  Chief Complaint  Patient presents with  . Vaginal Redness    pt states she had her period for 3 weeks and ever since her vaginal skin has been red. 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    Patient presents with several of vaginal irritation and redness. Took 1 day of monistat with little relief. Has not noticed if she has had an abnormal discharge, unsure if she is having dysuria but does feel some irritation in her urethra. Prior to these sxs, she had a 3 week painless period which is abnormal for her. The bleeding was heavy, finally stopped about 3 days ago.  Denies possibility of STDs or pregnancy, states she has been abstinent for 5 years now. Does use Ivory soap to wash her private areas, states she has had intermittent irritation from this previously that resolved with use of baby soap.   Also states she has noticed that she doesn't hear as well as she used to anymore, would like eval by an audiologist. Julie Patel if worse in one ear or the other. No known trauma, prior infections, or other causes.   Relevant past medical, surgical, family and social history reviewed and updated as indicated. Interim medical history since our last visit reviewed. Allergies and medications reviewed and updated.  Review of Systems  Constitutional: Negative.   HENT: Negative.   Eyes: Negative.   Respiratory: Negative.   Cardiovascular: Negative.   Gastrointestinal: Negative.   Genitourinary: Positive for dysuria, menstrual problem and vaginal pain.  Musculoskeletal: Negative.   Skin: Negative.   Neurological: Negative.     Psychiatric/Behavioral: Negative.     Per HPI unless specifically indicated above     Objective:    BP 117/79 (BP Location: Right Arm, Patient Position: Sitting, Cuff Size: Normal)   Pulse 69   Temp 97.9 F (36.6 C)   Ht 5' 5.5" (1.664 m)   Wt 169 lb 3.2 oz (76.7 kg)   LMP 09/25/2016 (Approximate)   SpO2 100%   BMI 27.73 kg/m   Wt Readings from Last 3 Encounters:  10/21/16 169 lb 3.2 oz (76.7 kg)  09/14/16 169 lb (76.7 kg)  05/25/16 164 lb (74.4 kg)    Physical Exam  Constitutional: She is oriented to person, place, and time. She appears well-developed and well-nourished. No distress.  HENT:  Head: Atraumatic.  Eyes: Conjunctivae are normal. No scleral icterus.  Neck: Normal range of motion. Neck supple.  Cardiovascular: Normal rate.   Pulmonary/Chest: Effort normal. No respiratory distress.  Abdominal: Soft. Bowel sounds are normal. She exhibits no distension. There is no tenderness.  Genitourinary:  Genitourinary Comments: Monistat cream recently applied  Musculoskeletal: Normal range of motion.  Neurological: She is alert and oriented to person, place, and time.  Skin: Skin is warm and dry.  Psychiatric: She has a normal mood and affect. Her behavior is normal.  Nursing note and vitals reviewed.   Results for orders placed or performed in visit on 09/14/16  Microscopic Examination  Result Value  Ref Range   WBC, UA 11-30 (A) 0 - 5 /hpf   RBC, UA 11-30 (A) 0 - 2 /hpf   Epithelial Cells (non renal) 0-10 0 - 10 /hpf   Bacteria, UA Moderate (A) None seen/Few  UA/M w/rflx Culture, Routine  Result Value Ref Range   Specific Gravity, UA 1.010 1.005 - 1.030   pH, UA 6.0 5.0 - 7.5   Color, UA Yellow Yellow   Appearance Ur Hazy (A) Clear   Leukocytes, UA 3+ (A) Negative   Protein, UA 1+ (A) Negative/Trace   Glucose, UA Negative Negative   Ketones, UA Negative Negative   RBC, UA 3+ (A) Negative   Bilirubin, UA Negative Negative   Urobilinogen, Ur 0.2 0.2 - 1.0  mg/dL   Nitrite, UA Positive (A) Negative   Microscopic Examination See below:    Urinalysis Reflex Comment   Urine Culture, Routine  Result Value Ref Range   Urine Culture, Routine Final report (A)    Urine Culture result 1 Escherichia coli (A)    ANTIMICROBIAL SUSCEPTIBILITY Comment       Assessment & Plan:   Problem List Items Addressed This Visit    None    Visit Diagnoses    Abnormal uterine bleeding (AUB)    -  Primary   Continue to monitor, likely peri-menopausal origin   Relevant Orders   CBC with Differential/Platelet   TSH   Vaginal irritation       U/A negative for infection.    Relevant Orders   UA/M w/rflx Culture, Routine   Decreased hearing of both ears       Per pt request, ENT referral placed for evaluation   Relevant Orders   Ambulatory referral to ENT    Deferred wet prep today as she has been using monistat and currently has a layer of the cream on. Will send a diflucan tablet, continue rest of monistat. Start using hyaluronic acid to moisturize and a gentle unscented soap to wash. If no improvement in 1 week, follow up.    Follow up plan: Return if symptoms worsen or fail to improve.

## 2016-10-21 NOTE — Patient Instructions (Addendum)
Follow up as needed  Hyaluronic acid for moisture Unscented dove soap or baby wash Probiotic Finish monistat

## 2016-10-22 ENCOUNTER — Telehealth: Payer: Self-pay | Admitting: Family Medicine

## 2016-10-22 LAB — CBC WITH DIFFERENTIAL/PLATELET
BASOS: 0 %
Basophils Absolute: 0 10*3/uL (ref 0.0–0.2)
EOS (ABSOLUTE): 0.1 10*3/uL (ref 0.0–0.4)
EOS: 2 %
HEMATOCRIT: 32.5 % — AB (ref 34.0–46.6)
Hemoglobin: 10.6 g/dL — ABNORMAL LOW (ref 11.1–15.9)
IMMATURE GRANULOCYTES: 0 %
Immature Grans (Abs): 0 10*3/uL (ref 0.0–0.1)
Lymphocytes Absolute: 1.6 10*3/uL (ref 0.7–3.1)
Lymphs: 32 %
MCH: 28 pg (ref 26.6–33.0)
MCHC: 32.6 g/dL (ref 31.5–35.7)
MCV: 86 fL (ref 79–97)
MONOS ABS: 0.3 10*3/uL (ref 0.1–0.9)
Monocytes: 6 %
NEUTROS ABS: 3 10*3/uL (ref 1.4–7.0)
NEUTROS PCT: 60 %
Platelets: 304 10*3/uL (ref 150–379)
RBC: 3.78 x10E6/uL (ref 3.77–5.28)
RDW: 13.8 % (ref 12.3–15.4)
WBC: 5.1 10*3/uL (ref 3.4–10.8)

## 2016-10-22 LAB — TSH: TSH: 1.9 u[IU]/mL (ref 0.450–4.500)

## 2016-10-22 NOTE — Telephone Encounter (Signed)
And let her know her thyroid came back normal

## 2016-10-22 NOTE — Telephone Encounter (Signed)
Patient notified

## 2016-10-22 NOTE — Telephone Encounter (Signed)
Please call pt and let her know that she is anemic (as we figured given the 3 weeks of menstrual bleeding). Lets have her come back for a repeat CBC in about 2 weeks to make sure that her levels have returned to normal. She is to let us know if she gets SOB or fatigued. She can make sure to eat an iron rich diet and take a multivitamin with iron in the meantime to help replenish her levels. Drink lots of water.

## 2016-10-23 ENCOUNTER — Telehealth: Payer: Self-pay | Admitting: Family Medicine

## 2016-10-23 ENCOUNTER — Encounter: Payer: Self-pay | Admitting: Family Medicine

## 2016-10-23 MED ORDER — FLUCONAZOLE 150 MG PO TABS: 150.0000 mg | ORAL_TABLET | Freq: Once | ORAL | 0 refills | Status: AC

## 2016-10-23 NOTE — Telephone Encounter (Signed)
Patient is still having itching, please send over the prescription.

## 2016-10-23 NOTE — Telephone Encounter (Signed)
Pt called stated the medication that Julie Patel gave her does not seem to be helping with the issue she was seen for. Pt would like to know if something different can be sent to her pharmacy. [facility] [place]. Thanks.

## 2016-10-23 NOTE — Telephone Encounter (Signed)
Rx sent to her pharmacy 

## 2016-10-23 NOTE — Telephone Encounter (Signed)
Can you please check with her to see if she is actually in discomfort now or not? I can't tell from her message. If she is not, she should just watch it. If she is, let me know and I'll send through another diflucan.

## 2016-10-26 NOTE — Telephone Encounter (Signed)
Issue was addressed via Mychart messaging with pt

## 2016-10-27 NOTE — Telephone Encounter (Signed)
Fyi.

## 2016-10-28 ENCOUNTER — Ambulatory Visit (INDEPENDENT_AMBULATORY_CARE_PROVIDER_SITE_OTHER): Payer: BC Managed Care – PPO | Admitting: Family Medicine

## 2016-10-28 ENCOUNTER — Encounter: Payer: Self-pay | Admitting: Family Medicine

## 2016-10-28 VITALS — BP 126/77 | HR 80 | Temp 97.7°F | Wt 168.6 lb

## 2016-10-28 DIAGNOSIS — N898 Other specified noninflammatory disorders of vagina: Secondary | ICD-10-CM

## 2016-10-28 DIAGNOSIS — L298 Other pruritus: Secondary | ICD-10-CM | POA: Diagnosis not present

## 2016-10-28 DIAGNOSIS — N939 Abnormal uterine and vaginal bleeding, unspecified: Secondary | ICD-10-CM

## 2016-10-28 LAB — WET PREP FOR TRICH, YEAST, CLUE
Clue Cell Exam: NEGATIVE
TRICHOMONAS EXAM: NEGATIVE
YEAST EXAM: NEGATIVE

## 2016-10-28 NOTE — Patient Instructions (Signed)
Follow up as needed

## 2016-10-28 NOTE — Progress Notes (Signed)
BP 126/77 (BP Location: Right Arm, Patient Position: Sitting, Cuff Size: Normal)   Pulse 80   Temp 97.7 F (36.5 C)   Wt 168 lb 9.6 oz (76.5 kg)   LMP 09/25/2016 (Approximate)   SpO2 98%   BMI 27.63 kg/m    Subjective:    Patient ID: Julie Patel, female    DOB: February 17, 1966, 50 y.o.   MRN: [id number]  HPI: Julie Patel is a 50 y.o. female  Chief Complaint  Patient presents with  . Follow-up    1 week f/up   Patient presents following up on persistent vaginal irritation and itching as well as abnormal vaginal bleeding. Was found to be slightly anemic last week following 3 weeks of heavy bleeding. Since this visit, has had one short episode of bleeding that lasted about an hour per patient. Still having intermittent itching and irritation, using anti itch vaginal creams and avoiding irritating soaps. Denies dyuria, fever, back pain, abdominal pain. Not currently sexually active.   Relevant past medical, surgical, family and social history reviewed and updated as indicated. Interim medical history since our last visit reviewed. Allergies and medications reviewed and updated.  Review of Systems  Constitutional: Negative.   HENT: Negative.   Respiratory: Negative.   Cardiovascular: Negative.   Gastrointestinal: Negative.   Genitourinary: Positive for menstrual problem and vaginal bleeding.  Musculoskeletal: Negative.   Neurological: Negative.   Psychiatric/Behavioral: Negative.     Per HPI unless specifically indicated above     Objective:    BP 126/77 (BP Location: Right Arm, Patient Position: Sitting, Cuff Size: Normal)   Pulse 80   Temp 97.7 F (36.5 C)   Wt 168 lb 9.6 oz (76.5 kg)   LMP 09/25/2016 (Approximate)   SpO2 98%   BMI 27.63 kg/m   Wt Readings from Last 3 Encounters:  10/28/16 168 lb 9.6 oz (76.5 kg)  10/21/16 169 lb 3.2 oz (76.7 kg)  09/14/16 169 lb (76.7 kg)    Physical Exam  Constitutional: She is oriented to person, place, and time.  She appears well-developed and well-nourished. No distress.  HENT:  Head: Atraumatic.  Eyes: Conjunctivae are normal. No scleral icterus.  Neck: Normal range of motion. Neck supple.  Cardiovascular: Normal rate, regular rhythm and normal heart sounds.   Pulmonary/Chest: Effort normal and breath sounds normal.  Genitourinary:  Genitourinary Comments: Vaginal mucosa mildly erythematous Minimal thin white discharge  Musculoskeletal: Normal range of motion.  Neurological: She is alert and oriented to person, place, and time.  Skin: Skin is warm and dry.  Psychiatric: She has a normal mood and affect. Her behavior is normal.  Nursing note and vitals reviewed.   Results for orders placed or performed in visit on 10/21/16  CBC with Differential/Platelet  Result Value Ref Range   WBC 5.1 3.4 - 10.8 x10E3/uL   RBC 3.78 3.77 - 5.28 x10E6/uL   Hemoglobin 10.6 (L) 11.1 - 15.9 g/dL   Hematocrit 32.5 (L) 34.0 - 46.6 %   MCV 86 79 - 97 fL   MCH 28.0 26.6 - 33.0 pg   MCHC 32.6 31.5 - 35.7 g/dL   RDW 13.8 12.3 - 15.4 %   Platelets 304 150 - 379 x10E3/uL   Neutrophils 60 Not Estab. %   Lymphs 32 Not Estab. %   Monocytes 6 Not Estab. %   Eos 2 Not Estab. %   Basos 0 Not Estab. %   Neutrophils Absolute 3.0 1.4 - 7.0 x10E3/uL  Lymphocytes Absolute 1.6 0.7 - 3.1 x10E3/uL   Monocytes Absolute 0.3 0.1 - 0.9 x10E3/uL   EOS (ABSOLUTE) 0.1 0.0 - 0.4 x10E3/uL   Basophils Absolute 0.0 0.0 - 0.2 x10E3/uL   Immature Granulocytes 0 Not Estab. %   Immature Grans (Abs) 0.0 0.0 - 0.1 x10E3/uL  TSH  Result Value Ref Range   TSH 1.900 0.450 - 4.500 uIU/mL  UA/M w/rflx Culture, Routine  Result Value Ref Range   Specific Gravity, UA 1.020 1.005 - 1.030   pH, UA 7.0 5.0 - 7.5   Color, UA Yellow Yellow   Appearance Ur Clear Clear   Leukocytes, UA Negative Negative   Protein, UA Negative Negative/Trace   Glucose, UA Negative Negative   Ketones, UA Negative Negative   RBC, UA Negative Negative    Bilirubin, UA Negative Negative   Urobilinogen, Ur 0.2 0.2 - 1.0 mg/dL   Nitrite, UA Negative Negative      Assessment & Plan:   Problem List Items Addressed This Visit    None    Visit Diagnoses    Vaginal itching    -  Primary   Wet prep negative, and failed multiple rounds of diflucan and monistat creams. Continue good vaginal hygiene practices, follow up with GYN   Relevant Orders   WET PREP FOR [place], YEAST, CLUE   Vaginal bleeding       Recheck CBC today, follow up with GYN if persisting   Relevant Orders   CBC with Differential/Platelet    Referral to OB/GYN placed. Await consult   Follow up plan: Return if symptoms worsen or fail to improve.

## 2016-10-29 LAB — CBC WITH DIFFERENTIAL/PLATELET
BASOS: 0 %
Basophils Absolute: 0 10*3/uL (ref 0.0–0.2)
EOS (ABSOLUTE): 0.1 10*3/uL (ref 0.0–0.4)
EOS: 2 %
HEMATOCRIT: 30.2 % — AB (ref 34.0–46.6)
Hemoglobin: 10 g/dL — ABNORMAL LOW (ref 11.1–15.9)
Immature Grans (Abs): 0 10*3/uL (ref 0.0–0.1)
Immature Granulocytes: 0 %
LYMPHS ABS: 1.6 10*3/uL (ref 0.7–3.1)
Lymphs: 27 %
MCH: 27.5 pg (ref 26.6–33.0)
MCHC: 33.1 g/dL (ref 31.5–35.7)
MCV: 83 fL (ref 79–97)
MONOS ABS: 0.5 10*3/uL (ref 0.1–0.9)
Monocytes: 8 %
NEUTROS ABS: 3.9 10*3/uL (ref 1.4–7.0)
Neutrophils: 63 %
Platelets: 289 10*3/uL (ref 150–379)
RBC: 3.63 x10E6/uL — ABNORMAL LOW (ref 3.77–5.28)
RDW: 13.7 % (ref 12.3–15.4)
WBC: 6 10*3/uL (ref 3.4–10.8)

## 2016-11-04 ENCOUNTER — Telehealth: Payer: Self-pay | Admitting: Family Medicine

## 2016-11-04 DIAGNOSIS — D649 Anemia, unspecified: Secondary | ICD-10-CM

## 2016-11-04 NOTE — Telephone Encounter (Signed)
Patient notified

## 2016-11-04 NOTE — Telephone Encounter (Signed)
Please call pt and let her know that her blood counts are still low and that I would like to check an anemia panel as they have gotten slightly worse despite her bleeding not recurring. I will order the lab and she can come in at her own convenience.

## 2016-11-05 ENCOUNTER — Other Ambulatory Visit: Payer: Self-pay | Admitting: Family Medicine

## 2016-11-05 ENCOUNTER — Other Ambulatory Visit: Payer: BC Managed Care – PPO

## 2016-11-05 DIAGNOSIS — D649 Anemia, unspecified: Secondary | ICD-10-CM

## 2016-11-07 LAB — ANEMIA PROFILE B
BASOS ABS: 0 10*3/uL (ref 0.0–0.2)
Basos: 1 %
EOS (ABSOLUTE): 0.1 10*3/uL (ref 0.0–0.4)
Eos: 2 %
FOLATE: 17.9 ng/mL (ref >3.0)
Ferritin: 13 ng/mL — ABNORMAL LOW (ref 15–150)
Hematocrit: 32.9 % — ABNORMAL LOW (ref 34.0–46.6)
Hemoglobin: 10.5 g/dL — ABNORMAL LOW (ref 11.1–15.9)
IRON: 98 ug/dL (ref 27–159)
Immature Grans (Abs): 0 10*3/uL (ref 0.0–0.1)
Immature Granulocytes: 0 %
Iron Saturation: 22 % (ref 15–55)
LYMPHS ABS: 1.4 10*3/uL (ref 0.7–3.1)
Lymphs: 32 %
MCH: 28.2 pg (ref 26.6–33.0)
MCHC: 31.9 g/dL (ref 31.5–35.7)
MCV: 88 fL (ref 79–97)
MONOS ABS: 0.4 10*3/uL (ref 0.1–0.9)
Monocytes: 8 %
NEUTROS ABS: 2.5 10*3/uL (ref 1.4–7.0)
Neutrophils: 57 %
PLATELETS: 249 10*3/uL (ref 150–379)
RBC: 3.72 x10E6/uL — AB (ref 3.77–5.28)
RDW: 14 % (ref 12.3–15.4)
Retic Ct Pct: 3.2 % — ABNORMAL HIGH (ref 0.6–2.6)
Total Iron Binding Capacity: 445 ug/dL (ref 250–450)
UIBC: 347 ug/dL (ref 131–425)
VITAMIN B 12: 622 pg/mL (ref 211–946)
WBC: 4.4 10*3/uL (ref 3.4–10.8)

## 2016-11-07 LAB — SPECIMEN STATUS REPORT

## 2016-11-09 ENCOUNTER — Other Ambulatory Visit: Payer: Self-pay | Admitting: Family Medicine

## 2016-11-09 MED ORDER — NORETHIN-ETH ESTRAD-FE BIPHAS 1 MG-10 MCG / 10 MCG PO TABS: 1.0000 | ORAL_TABLET | Freq: Every day | ORAL | 11 refills | Status: DC

## 2016-11-09 NOTE — Telephone Encounter (Signed)
Please call pt and let her know that her blood counts are improving some, and her anemia panel shows just a mild iron deficiency so she can start taking an iron supplement as well as increasing iron rich foods in her diet

## 2016-11-09 NOTE — Telephone Encounter (Signed)
Please call pt and let her know that I am recommending that she start a birth control pill to help control her periods until GYN can evaluate her. Have her skip the placebo week so that she does not bleed until they see her.

## 2016-11-09 NOTE — Telephone Encounter (Signed)
Patient notified

## 2016-11-09 NOTE — Telephone Encounter (Signed)
Patient notified. She was not feeling well today, very tired had bp checked at school and it was 132/84. She is on her period again for the past 10 days. Wants advice. She states she hasn't gotten her GYN appt time yet.

## 2016-12-02 ENCOUNTER — Ambulatory Visit (INDEPENDENT_AMBULATORY_CARE_PROVIDER_SITE_OTHER): Payer: BC Managed Care – PPO | Admitting: Family Medicine

## 2016-12-02 ENCOUNTER — Encounter: Payer: Self-pay | Admitting: Family Medicine

## 2016-12-02 VITALS — BP 111/72 | HR 69 | Temp 97.8°F | Ht 64.2 in | Wt 168.4 lb

## 2016-12-02 DIAGNOSIS — Z1231 Encounter for screening mammogram for malignant neoplasm of breast: Secondary | ICD-10-CM | POA: Diagnosis not present

## 2016-12-02 DIAGNOSIS — Z0000 Encounter for general adult medical examination without abnormal findings: Secondary | ICD-10-CM

## 2016-12-02 DIAGNOSIS — Z1239 Encounter for other screening for malignant neoplasm of breast: Secondary | ICD-10-CM

## 2016-12-02 DIAGNOSIS — N921 Excessive and frequent menstruation with irregular cycle: Secondary | ICD-10-CM

## 2016-12-02 DIAGNOSIS — Z13220 Encounter for screening for lipoid disorders: Secondary | ICD-10-CM | POA: Diagnosis not present

## 2016-12-02 LAB — UA/M W/RFLX CULTURE, ROUTINE
BILIRUBIN UA: NEGATIVE
Glucose, UA: NEGATIVE
KETONES UA: NEGATIVE
Leukocytes, UA: NEGATIVE
NITRITE UA: NEGATIVE
SPEC GRAV UA: 1.015 (ref 1.005–1.030)
UUROB: 0.2 mg/dL (ref 0.2–1.0)
pH, UA: 7 (ref 5.0–7.5)

## 2016-12-02 LAB — MICROSCOPIC EXAMINATION
BACTERIA UA: NONE SEEN
WBC UA: NONE SEEN /HPF (ref 0–?)

## 2016-12-02 NOTE — Patient Instructions (Addendum)
GYN Appointment- [facility], Dr. Enzo Bi Thursday 12/31/16- 10:45AM [address], [place], [place] [address] [phone]   Health Maintenance, Female Introduction Adopting a healthy lifestyle and getting preventive care can go a long way to promote health and wellness. Talk with your health care provider about what schedule of regular examinations is right for you. This is a good chance for you to check in with your provider about disease prevention and staying healthy. In between checkups, there are plenty of things you can do on your own. Experts have done a lot of research about which lifestyle changes and preventive measures are most likely to keep you healthy. Ask your health care provider for more information. Weight and diet Eat a healthy diet  Be sure to include plenty of vegetables, fruits, low-fat dairy products, and lean protein.  Do not eat a lot of foods high in solid fats, added sugars, or salt.  Get regular exercise. This is one of the most important things you can do for your health.  Most adults should exercise for at least 150 minutes each week. The exercise should increase your heart rate and make you sweat (moderate-intensity exercise).  Most adults should also do strengthening exercises at least twice a week. This is in addition to the moderate-intensity exercise. Maintain a healthy weight  Body mass index (BMI) is a measurement that can be used to identify possible weight problems. It estimates body fat based on height and weight. Your health care provider can help determine your BMI and help you achieve or maintain a healthy weight.  For females 88 years of age and older:  A BMI below 18.5 is considered underweight.  A BMI of 18.5 to 24.9 is normal.  A BMI of 25 to 29.9 is considered overweight.  A BMI of 30 and above is considered obese. Watch levels of cholesterol and blood lipids  You should start having your blood tested for  lipids and cholesterol at 48 years of age, then have this test every 5 years.  You may need to have your cholesterol levels checked more often if:  Your lipid or cholesterol levels are high.  You are older than 48 years of age.  You are at high risk for heart disease. Cancer screening Lung Cancer  Lung cancer screening is recommended for adults 3-49 years old who are at high risk for lung cancer because of a history of smoking.  A yearly low-dose CT scan of the lungs is recommended for people who:  Currently smoke.  Have quit within the past 15 years.  Have at least a 30-pack-year history of smoking. A pack year is smoking an average of one pack of cigarettes a day for 1 year.  Yearly screening should continue until it has been 15 years since you quit.  Yearly screening should stop if you develop a health problem that would prevent you from having lung cancer treatment. Breast Cancer  Practice breast self-awareness. This means understanding how your breasts normally appear and feel.  It also means doing regular breast self-exams. Let your health care provider know about any changes, no matter how small.  If you are in your 20s or 30s, you should have a clinical breast exam (CBE) by a health care provider every 1-3 years as part of a regular health exam.  If you are 41 or older, have a CBE every year. Also consider having a breast X-ray (mammogram) every year.  If you have a family history  of breast cancer, talk to your health care provider about genetic screening.  If you are at high risk for breast cancer, talk to your health care provider about having an MRI and a mammogram every year.  Breast cancer gene (BRCA) assessment is recommended for women who have family members with BRCA-related cancers. BRCA-related cancers include:  Breast.  Ovarian.  Tubal.  Peritoneal cancers.  Results of the assessment will determine the need for genetic counseling and BRCA1 and  BRCA2 testing. Cervical Cancer  Your health care provider may recommend that you be screened regularly for cancer of the pelvic organs (ovaries, uterus, and vagina). This screening involves a pelvic examination, including checking for microscopic changes to the surface of your cervix (Pap test). You may be encouraged to have this screening done every 3 years, beginning at age 16.  For women ages 81-65, health care providers may recommend pelvic exams and Pap testing every 3 years, or they may recommend the Pap and pelvic exam, combined with testing for human papilloma virus (HPV), every 5 years. Some types of HPV increase your risk of cervical cancer. Testing for HPV may also be done on women of any age with unclear Pap test results.  Other health care providers may not recommend any screening for nonpregnant women who are considered low risk for pelvic cancer and who do not have symptoms. Ask your health care provider if a screening pelvic exam is right for you.  If you have had past treatment for cervical cancer or a condition that could lead to cancer, you need Pap tests and screening for cancer for at least 20 years after your treatment. If Pap tests have been discontinued, your risk factors (such as having a new sexual partner) need to be reassessed to determine if screening should resume. Some women have medical problems that increase the chance of getting cervical cancer. In these cases, your health care provider may recommend more frequent screening and Pap tests. Colorectal Cancer  This type of cancer can be detected and often prevented.  Routine colorectal cancer screening usually begins at 48 years of age and continues through 48 years of age.  Your health care provider may recommend screening at an earlier age if you have risk factors for colon cancer.  Your health care provider may also recommend using home test kits to check for hidden blood in the stool.  A small camera at the end  of a tube can be used to examine your colon directly (sigmoidoscopy or colonoscopy). This is done to check for the earliest forms of colorectal cancer.  Routine screening usually begins at age 30.  Direct examination of the colon should be repeated every 5-10 years through 48 years of age. However, you may need to be screened more often if early forms of precancerous polyps or small growths are found. Skin Cancer  Check your skin from head to toe regularly.  Tell your health care provider about any new moles or changes in moles, especially if there is a change in a mole's shape or color.  Also tell your health care provider if you have a mole that is larger than the size of a pencil eraser.  Always use sunscreen. Apply sunscreen liberally and repeatedly throughout the day.  Protect yourself by wearing long sleeves, pants, a wide-brimmed hat, and sunglasses whenever you are outside. Heart disease, diabetes, and high blood pressure  High blood pressure causes heart disease and increases the risk of stroke. High blood pressure is  more likely to develop in:  People who have blood pressure in the high end of the normal range (130-139/85-89 mm Hg).  People who are overweight or obese.  People who are African American.  If you are 67-88 years of age, have your blood pressure checked every 3-5 years. If you are 43 years of age or older, have your blood pressure checked every year. You should have your blood pressure measured twice-once when you are at a hospital or clinic, and once when you are not at a hospital or clinic. Record the average of the two measurements. To check your blood pressure when you are not at a hospital or clinic, you can use:  An automated blood pressure machine at a pharmacy.  A home blood pressure monitor.  If you are between 49 years and 51 years old, ask your health care provider if you should take aspirin to prevent strokes.  Have regular diabetes screenings.  This involves taking a blood sample to check your fasting blood sugar level.  If you are at a normal weight and have a low risk for diabetes, have this test once every three years after 48 years of age.  If you are overweight and have a high risk for diabetes, consider being tested at a younger age or more often. Preventing infection Hepatitis B  If you have a higher risk for hepatitis B, you should be screened for this virus. You are considered at high risk for hepatitis B if:  You were born in a country where hepatitis B is common. Ask your health care provider which countries are considered high risk.  Your parents were born in a high-risk country, and you have not been immunized against hepatitis B (hepatitis B vaccine).  You have HIV or AIDS.  You use needles to inject street drugs.  You live with someone who has hepatitis B.  You have had sex with someone who has hepatitis B.  You get hemodialysis treatment.  You take certain medicines for conditions, including cancer, organ transplantation, and autoimmune conditions. Hepatitis C  Blood testing is recommended for:  Everyone born from [birth year] through 1963.  Anyone with known risk factors for hepatitis C. Sexually transmitted infections (STIs)  You should be screened for sexually transmitted infections (STIs) including gonorrhea and chlamydia if:  You are sexually active and are younger than 48 years of age.  You are older than 48 years of age and your health care provider tells you that you are at risk for this type of infection.  Your sexual activity has changed since you were last screened and you are at an increased risk for chlamydia or gonorrhea. Ask your health care provider if you are at risk.  If you do not have HIV, but are at risk, it may be recommended that you take a prescription medicine daily to prevent HIV infection. This is called pre-exposure prophylaxis (PrEP). You are considered at risk if:  You are  sexually active and do not regularly use condoms or know the HIV status of your partner(s).  You take drugs by injection.  You are sexually active with a partner who has HIV. Talk with your health care provider about whether you are at high risk of being infected with HIV. If you choose to begin PrEP, you should first be tested for HIV. You should then be tested every 3 months for as long as you are taking PrEP. Pregnancy  If you are premenopausal and you may become pregnant,  ask your health care provider about preconception counseling.  If you may become pregnant, take 400 to 800 micrograms (mcg) of folic acid every day.  If you want to prevent pregnancy, talk to your health care provider about birth control (contraception). Osteoporosis and menopause  Osteoporosis is a disease in which the bones lose minerals and strength with aging. This can result in serious bone fractures. Your risk for osteoporosis can be identified using a bone density scan.  If you are 64 years of age or older, or if you are at risk for osteoporosis and fractures, ask your health care provider if you should be screened.  Ask your health care provider whether you should take a calcium or vitamin D supplement to lower your risk for osteoporosis.  Menopause may have certain physical symptoms and risks.  Hormone replacement therapy may reduce some of these symptoms and risks. Talk to your health care provider about whether hormone replacement therapy is right for you. Follow these instructions at home:  Schedule regular health, dental, and eye exams.  Stay current with your immunizations.  Do not use any tobacco products including cigarettes, chewing tobacco, or electronic cigarettes.  If you are pregnant, do not drink alcohol.  If you are breastfeeding, limit how much and how often you drink alcohol.  Limit alcohol intake to no more than 1 drink per day for nonpregnant women. One drink equals 12 ounces of  beer, 5 ounces of wine, or 1 ounces of hard liquor.  Do not use street drugs.  Do not share needles.  Ask your health care provider for help if you need support or information about quitting drugs.  Tell your health care provider if you often feel depressed.  Tell your health care provider if you have ever been abused or do not feel safe at home. This information is not intended to replace advice given to you by your health care provider. Make sure you discuss any questions you have with your health care provider. Document Released: 06/08/2011 Document Revised: 04/30/2016 Document Reviewed: 08/27/2015  2017 Elsevier  Iron Deficiency Anemia, Adult Iron-deficiency anemia is when you have a low amount of red blood cells or hemoglobin. This happens because you have too little iron in your body. Hemoglobin carries oxygen to parts of the body. Anemia can cause your body to not get enough oxygen. It may or may not cause symptoms. Follow these instructions at home: Medicines  Take over-the-counter and prescription medicines only as told by your doctor. This includes iron pills (supplements) and vitamins.  If you cannot handle taking iron pills by mouth, ask your doctor about getting iron through:  A vein (intravenously).  A shot (injection) into a muscle.  Take iron pills when your stomach is empty. If you cannot handle this, take them with food.  Do not drink milk or take antacids at the same time as your iron pills.  To prevent trouble pooping (constipation), eat fiber or take medicine (stool softener) as told by your doctor. Eating and drinking  Talk with your doctor before changing the foods you eat. He or she may tell you to eat foods that have a lot of iron, such as:  Liver.  Lowfat (lean) beef.  Breads and cereals that have iron added to them (fortified breads and cereals).  Eggs.  Dried fruit.  Dark green, leafy vegetables.  Drink enough fluid to keep your pee  (urine) clear or pale yellow.  Eat fresh fruits and vegetables that are high  in vitamin C. They help your body to use iron. Foods with a lot of vitamin C include:  Oranges.  Peppers.  Tomatoes.  Mangoes. General instructions  Return to your normal activities as told by your doctor. Ask your doctor what activities are safe for you.  Keep yourself clean, and keep things clean around you (your surroundings). Anemia can make you get sick more easily.  Keep all follow-up visits as told by your doctor. This is important. Contact a doctor if:  You feel sick to your stomach (nauseous).  You throw up (vomit).  You feel weak.  You are sweating for no clear reason.  You have trouble pooping, such as:  Pooping (having a bowel movement) less than 3 times a week.  Straining to poop.  Having poop that is hard, dry, or larger than normal.  Feeling full or bloated.  Pain in the lower belly.  Not feeling better after pooping. Get help right away if:  You pass out (faint). If this happens, do not drive yourself to the hospital. Call your local emergency services (911 in the [place]  You have chest pain.  You have shortness of breath that:  Is very bad.  Gets worse with physical activity.  You have a fast heartbeat.  You get light-headed when getting up from sitting or lying down. This information is not intended to replace advice given to you by your health care provider. Make sure you discuss any questions you have with your health care provider. Document Released: 12/26/2010 Document Revised: 08/12/2016 Document Reviewed: 08/12/2016 Elsevier Interactive Patient Education  2017 [employer].

## 2016-12-02 NOTE — Progress Notes (Signed)
BP 111/72 (BP Location: Left Arm, Patient Position: Sitting, Cuff Size: Normal)   Pulse 69   Temp 97.8 F (36.6 C)   Ht 5' 4.2" (1.631 m)   Wt 168 lb 6.4 oz (76.4 kg)   LMP 11/26/2016 (Exact Date)   SpO2 99%   BMI 28.73 kg/m    Subjective:    Patient ID: Julie Patel, female    DOB: May 17, 1966, 50 y.o.   MRN: [id number]  HPI: Julie Patel is a 50 y.o. female presenting on 12/02/2016 for comprehensive medical examination. Current medical complaints include:  ABNORMAL MENSTRUAL PERIODS Duration: 2 months,  Average interval between menses: 21 days or less Length of menses: 3 weeks of bleeding, 1 week off Flow: heavy Dysmenorrhea: no Intermenstrual bleeding:no Postcoital bleeding: no Sexual activity: Not sexually active History of sexually transmitted diseases: no History GYN procedures: no Abnormal pap smears: no   Dyspareunia: no Vaginal discharge:no Abdominal pain: no Galactorrhea: no Hirsuitism: no Frequent bruising/mucosal bleeding: no Double vision:no Hot flashes: no  She currently lives with: husband and son Menopausal Symptoms: no  Depression Screen done today and results listed below:  Depression screen [facility] 2/9 12/02/2016  Decreased Interest 0  Down, Depressed, Hopeless 0  PHQ - 2 Score 0    Past Medical History:  History reviewed. No pertinent past medical history.  Surgical History:  History reviewed. No pertinent surgical history.  Medications:  Current Outpatient Prescriptions on File Prior to Visit  Medication Sig  . cyclobenzaprine (FLEXERIL) 10 MG tablet Take 1 tablet (10 mg total) by mouth at bedtime. (Patient taking differently: Take 10 mg by mouth at bedtime as needed. )  . ibuprofen (ADVIL,MOTRIN) 600 MG tablet Take 1 tablet (600 mg total) by mouth every 8 (eight) hours as needed.   No current facility-administered medications on file prior to visit.     Allergies:  Allergies  Allergen Reactions  . Codeine Nausea And  Vomiting    Social History:  Social History   Social History  . Marital status: Married    Spouse name: N/A  . Number of children: N/A  . Years of education: N/A   Occupational History  . Not on file.   Social History Main Topics  . Smoking status: Never Smoker  . Smokeless tobacco: Never Used  . Alcohol use No  . Drug use: No  . Sexual activity: Not Currently   Other Topics Concern  . Not on file   Social History Narrative  . No narrative on file   History  Smoking Status  . Never Smoker  Smokeless Tobacco  . Never Used   History  Alcohol Use No    Family History:  Family History  Problem Relation Age of Onset  . Alzheimer's disease Mother   . Cancer Father     Prostate  . Hypertension Brother   . Alzheimer's disease Maternal Grandmother   . Cancer Maternal Grandfather     Lung  . Stroke Paternal Grandfather     Past medical history, surgical history, medications, allergies, family history and social history reviewed with patient today and changes made to appropriate areas of the chart.   Review of Systems  Constitutional: Negative.   HENT: Negative.   Eyes: Negative.   Respiratory: Negative.   Cardiovascular: Positive for leg swelling (just at night when she's been on her feet). Negative for chest pain, palpitations, orthopnea, claudication and PND.  Gastrointestinal: Negative.   Genitourinary: Negative.   Musculoskeletal: Negative.  Skin: Negative.   Neurological: Negative.   Endo/Heme/Allergies: Positive for polydipsia. Negative for environmental allergies. Does not bruise/bleed easily.  Psychiatric/Behavioral: Negative.     All other ROS negative except what is listed above and in the HPI.      Objective:    BP 111/72 (BP Location: Left Arm, Patient Position: Sitting, Cuff Size: Normal)   Pulse 69   Temp 97.8 F (36.6 C)   Ht 5' 4.2" (1.631 m)   Wt 168 lb 6.4 oz (76.4 kg)   LMP 11/26/2016 (Exact Date)   SpO2 99%   BMI 28.73 kg/m    Wt Readings from Last 3 Encounters:  12/02/16 168 lb 6.4 oz (76.4 kg)  10/28/16 168 lb 9.6 oz (76.5 kg)  10/21/16 169 lb 3.2 oz (76.7 kg)    Physical Exam  Constitutional: She is oriented to person, place, and time. She appears well-developed and well-nourished. No distress.  HENT:  Head: Normocephalic and atraumatic.  Right Ear: Hearing, tympanic membrane, external ear and ear canal normal.  Left Ear: Hearing, tympanic membrane, external ear and ear canal normal.  Nose: Nose normal.  Mouth/Throat: Uvula is midline, oropharynx is clear and moist and mucous membranes are normal. No oropharyngeal exudate.  Eyes: Conjunctivae, EOM and lids are normal. Pupils are equal, round, and reactive to light. Right eye exhibits no discharge. Left eye exhibits no discharge. No scleral icterus.  Neck: Normal range of motion. Neck supple. No JVD present. No tracheal deviation present. No thyromegaly present.  Cardiovascular: Normal rate, regular rhythm, normal heart sounds and intact distal pulses.  Exam reveals no gallop and no friction rub.   No murmur heard. Pulmonary/Chest: Effort normal and breath sounds normal. No stridor. No respiratory distress. She has no wheezes. She has no rales. She exhibits no tenderness. Right breast exhibits no inverted nipple, no mass, no nipple discharge, no skin change and no tenderness. Left breast exhibits no inverted nipple, no mass, no nipple discharge, no skin change and no tenderness. Breasts are symmetrical.  Abdominal: Soft. Bowel sounds are normal. She exhibits no distension and no mass. There is no tenderness. There is no rebound and no guarding.  Genitourinary:  Genitourinary Comments: Pelvic exam deferred with shared decision making.  Musculoskeletal: Normal range of motion. She exhibits no edema, tenderness or deformity.  Lymphadenopathy:    She has no cervical adenopathy.  Neurological: She is alert and oriented to person, place, and time. She has normal  reflexes. She displays normal reflexes. No cranial nerve deficit. She exhibits normal muscle tone. Coordination normal.  Skin: Skin is warm, dry and intact. No rash noted. She is not diaphoretic. No erythema. No pallor.  Psychiatric: She has a normal mood and affect. Her speech is normal and behavior is normal. Judgment and thought content normal. Cognition and memory are normal.  Nursing note and vitals reviewed.   Results for orders placed or performed in visit on 11/05/16  Anemia Profile B  Result Value Ref Range   Total Iron Binding Capacity 445 250 - 450 ug/dL   UIBC 347 131 - 425 ug/dL   Iron 98 27 - 159 ug/dL   Iron Saturation 22 15 - 55 %   Ferritin 13 (L) 15 - 150 ng/mL   Vitamin B-12 622 211 - 946 pg/mL   Folate 17.9 >3.0 ng/mL   WBC 4.4 3.4 - 10.8 x10E3/uL   RBC 3.72 (L) 3.77 - 5.28 x10E6/uL   Hemoglobin 10.5 (L) 11.1 - 15.9 g/dL   Hematocrit  32.9 (L) 34.0 - 46.6 %   MCV 88 79 - 97 fL   MCH 28.2 26.6 - 33.0 pg   MCHC 31.9 31.5 - 35.7 g/dL   RDW 14.0 12.3 - 15.4 %   Platelets 249 150 - 379 x10E3/uL   Neutrophils 57 Not Estab. %   Lymphs 32 Not Estab. %   Monocytes 8 Not Estab. %   Eos 2 Not Estab. %   Basos 1 Not Estab. %   Neutrophils Absolute 2.5 1.4 - 7.0 x10E3/uL   Lymphocytes Absolute 1.4 0.7 - 3.1 x10E3/uL   Monocytes Absolute 0.4 0.1 - 0.9 x10E3/uL   EOS (ABSOLUTE) 0.1 0.0 - 0.4 x10E3/uL   Basophils Absolute 0.0 0.0 - 0.2 x10E3/uL   Immature Granulocytes 0 Not Estab. %   Immature Grans (Abs) 0.0 0.0 - 0.1 x10E3/uL   Retic Ct Pct 3.2 (H) 0.6 - 2.6 %  Specimen status report  Result Value Ref Range   specimen status report Comment       Assessment & Plan:   Problem List Items Addressed This Visit    None    Visit Diagnoses    Routine general medical examination at a health care facility    -  Primary   Up to date on vaccines. Screening labs checked today. Pap up to date. Mammogram ordered today. Continue diet and exercise. Call with concerns.     Relevant Orders   Comprehensive metabolic panel   Lipid Panel w/o Chol/HDL Ratio   UA/M w/rflx Culture, Routine   Screening for breast cancer       Mammogram ordered today.   Relevant Orders   MM DIGITAL SCREENING BILATERAL   Menorrhagia with irregular cycle       Will check labs and ultrasound. Does not want to take OCP- to see GYN 12/31/16. Call with any concerns. Await results.    Relevant Orders   CBC with Differential/Platelet   Iron and TIBC   TSH   Ferritin   LH   FSH   Prolactin   DHEA-sulfate   Testosterone, free, total   Estradiol   US Transvaginal Non-OB   US Pelvis Limited   Screening for cholesterol level       Labs checked today. Await results.    Relevant Orders   Lipid Panel w/o Chol/HDL Ratio       Follow up plan: Return in about 5 weeks (around 01/06/2017) for Follow up periods.   LABORATORY TESTING:  - Pap smear: up to date  IMMUNIZATIONS:   - Tdap: Tetanus vaccination status reviewed: last tetanus booster within 10 years. - Influenza: Up to date - Pneumovax: Not applicable - Prevnar: Not applicable - Zostavax vaccine: Not applicable  SCREENING: -Mammogram: Ordered Today   PATIENT COUNSELING:   Advised to take 1 mg of folate supplement per day if capable of pregnancy.   Sexuality: Discussed sexually transmitted diseases, partner selection, use of condoms, avoidance of unintended pregnancy  and contraceptive alternatives.   Advised to avoid cigarette smoking.  I discussed with the patient that most people either abstain from alcohol or drink within safe limits (<=14/week and <=4 drinks/occasion for males, <=7/weeks and <= 3 drinks/occasion for females) and that the risk for alcohol disorders and other health effects rises proportionally with the number of drinks per week and how often a drinker exceeds daily limits.  Discussed cessation/primary prevention of drug use and availability of treatment for abuse.   Diet: Encouraged to adjust caloric  intake to  maintain  or achieve ideal body weight, to reduce intake of dietary saturated fat and total fat, to limit sodium intake by avoiding high sodium foods and not adding table salt, and to maintain adequate dietary potassium and calcium preferably from fresh fruits, vegetables, and low-fat dairy products.    stressed the importance of regular exercise  Injury prevention: Discussed safety belts, safety helmets, smoke detector, smoking near bedding or upholstery.   Dental health: Discussed importance of regular tooth brushing, flossing, and dental visits.    NEXT PREVENTATIVE PHYSICAL DUE IN 1 YEAR. Return in about 5 weeks (around 01/06/2017) for Follow up periods.

## 2016-12-03 LAB — COMPREHENSIVE METABOLIC PANEL
A/G RATIO: 1.3 (ref 1.2–2.2)
ALBUMIN: 3.9 g/dL (ref 3.5–5.5)
ALT: 16 IU/L (ref 0–32)
AST: 19 IU/L (ref 0–40)
Alkaline Phosphatase: 64 IU/L (ref 39–117)
BILIRUBIN TOTAL: 0.4 mg/dL (ref 0.0–1.2)
BUN / CREAT RATIO: 17 (ref 9–23)
BUN: 14 mg/dL (ref 6–24)
CHLORIDE: 101 mmol/L (ref 96–106)
CO2: 24 mmol/L (ref 18–29)
Calcium: 9 mg/dL (ref 8.7–10.2)
Creatinine, Ser: 0.81 mg/dL (ref 0.57–1.00)
GFR calc non Af Amer: 86 mL/min/{1.73_m2} (ref >59)
GFR, EST AFRICAN AMERICAN: 99 mL/min/{1.73_m2} (ref >59)
Globulin, Total: 3.1 g/dL (ref 1.5–4.5)
Glucose: 88 mg/dL (ref 65–99)
POTASSIUM: 4.5 mmol/L (ref 3.5–5.2)
Sodium: 139 mmol/L (ref 134–144)
TOTAL PROTEIN: 7 g/dL (ref 6.0–8.5)

## 2016-12-03 LAB — CBC WITH DIFFERENTIAL/PLATELET
BASOS: 1 %
Basophils Absolute: 0 10*3/uL (ref 0.0–0.2)
EOS (ABSOLUTE): 0.1 10*3/uL (ref 0.0–0.4)
Eos: 2 %
HEMOGLOBIN: 9.2 g/dL — AB (ref 11.1–15.9)
Hematocrit: 28.5 % — ABNORMAL LOW (ref 34.0–46.6)
IMMATURE GRANS (ABS): 0 10*3/uL (ref 0.0–0.1)
Immature Granulocytes: 0 %
LYMPHS ABS: 1.3 10*3/uL (ref 0.7–3.1)
LYMPHS: 31 %
MCH: 26.2 pg — AB (ref 26.6–33.0)
MCHC: 32.3 g/dL (ref 31.5–35.7)
MCV: 81 fL (ref 79–97)
Monocytes Absolute: 0.3 10*3/uL (ref 0.1–0.9)
Monocytes: 8 %
NEUTROS ABS: 2.4 10*3/uL (ref 1.4–7.0)
Neutrophils: 58 %
PLATELETS: 221 10*3/uL (ref 150–379)
RBC: 3.51 x10E6/uL — ABNORMAL LOW (ref 3.77–5.28)
RDW: 13.6 % (ref 12.3–15.4)
WBC: 4.2 10*3/uL (ref 3.4–10.8)

## 2016-12-03 LAB — LIPID PANEL W/O CHOL/HDL RATIO
Cholesterol, Total: 241 mg/dL — ABNORMAL HIGH (ref 100–199)
HDL: 48 mg/dL (ref >39)
LDL Calculated: 141 mg/dL — ABNORMAL HIGH (ref 0–99)
Triglycerides: 258 mg/dL — ABNORMAL HIGH (ref 0–149)
VLDL CHOLESTEROL CAL: 52 mg/dL — AB (ref 5–40)

## 2016-12-03 LAB — TESTOSTERONE, FREE, TOTAL, SHBG
SEX HORMONE BINDING: 47.2 nmol/L (ref 24.6–122.0)
TESTOSTERONE FREE: 0.7 pg/mL (ref 0.0–4.2)
TESTOSTERONE: 10 ng/dL (ref 8–48)

## 2016-12-03 LAB — LUTEINIZING HORMONE: LH: 2.3 m[IU]/mL

## 2016-12-03 LAB — ESTRADIOL: Estradiol: 46.1 pg/mL

## 2016-12-03 LAB — IRON AND TIBC
Iron Saturation: 4 % — CL (ref 15–55)
Iron: 18 ug/dL — ABNORMAL LOW (ref 27–159)
Total Iron Binding Capacity: 459 ug/dL — ABNORMAL HIGH (ref 250–450)
UIBC: 441 ug/dL — ABNORMAL HIGH (ref 131–425)

## 2016-12-03 LAB — DHEA-SULFATE: DHEA SO4: 163.9 ug/dL (ref 41.2–243.7)

## 2016-12-03 LAB — FOLLICLE STIMULATING HORMONE: FSH: 10.6 m[IU]/mL

## 2016-12-03 LAB — PROLACTIN: PROLACTIN: 7.1 ng/mL (ref 4.8–23.3)

## 2016-12-03 LAB — FERRITIN: FERRITIN: 6 ng/mL — AB (ref 15–150)

## 2016-12-03 LAB — TSH: TSH: 2.4 u[IU]/mL (ref 0.450–4.500)

## 2016-12-04 ENCOUNTER — Telehealth: Payer: Self-pay | Admitting: Family Medicine

## 2016-12-04 MED ORDER — FERROUS SULFATE 325 (65 FE) MG PO TABS: 325.0000 mg | ORAL_TABLET | Freq: Three times a day (TID) | ORAL | 3 refills | Status: DC

## 2016-12-04 NOTE — Telephone Encounter (Signed)
Called and let patient know that her iron came back low and she is anemic. Will start dedicated iron supplement. Call with any concerns.

## 2016-12-11 ENCOUNTER — Telehealth: Payer: Self-pay | Admitting: Family Medicine

## 2016-12-11 ENCOUNTER — Inpatient Hospital Stay: Admission: RE | Admit: 2016-12-11 | Payer: Self-pay | Source: Ambulatory Visit

## 2016-12-11 ENCOUNTER — Ambulatory Visit
Admission: RE | Admit: 2016-12-11 | Discharge: 2016-12-11 | Disposition: A | Discharge status: Home / Self Care | Payer: BC Managed Care – PPO | Source: Ambulatory Visit | Attending: Family Medicine | Admitting: Family Medicine

## 2016-12-11 DIAGNOSIS — D259 Leiomyoma of uterus, unspecified: Secondary | ICD-10-CM | POA: Insufficient documentation

## 2016-12-11 DIAGNOSIS — N921 Excessive and frequent menstruation with irregular cycle: Secondary | ICD-10-CM | POA: Diagnosis present

## 2016-12-11 DIAGNOSIS — D219 Benign neoplasm of connective and other soft tissue, unspecified: Secondary | ICD-10-CM

## 2016-12-11 NOTE — Telephone Encounter (Signed)
Called and gave Julie Patel her results. Has a fibroid in her uterus. She is seeing GYN at the end of the month.

## 2016-12-31 ENCOUNTER — Encounter: Payer: BC Managed Care – PPO | Admitting: Obstetrics and Gynecology

## 2017-01-06 ENCOUNTER — Ambulatory Visit: Payer: BC Managed Care – PPO | Admitting: Family Medicine

## 2017-01-14 ENCOUNTER — Ambulatory Visit (INDEPENDENT_AMBULATORY_CARE_PROVIDER_SITE_OTHER): Payer: BC Managed Care – PPO | Admitting: Obstetrics and Gynecology

## 2017-01-14 ENCOUNTER — Encounter: Payer: Self-pay | Admitting: Obstetrics and Gynecology

## 2017-01-14 VITALS — BP 112/73 | HR 80 | Ht 64.0 in | Wt 172.4 lb

## 2017-01-14 DIAGNOSIS — N920 Excessive and frequent menstruation with regular cycle: Secondary | ICD-10-CM

## 2017-01-14 DIAGNOSIS — D500 Iron deficiency anemia secondary to blood loss (chronic): Secondary | ICD-10-CM | POA: Diagnosis not present

## 2017-01-14 DIAGNOSIS — D259 Leiomyoma of uterus, unspecified: Secondary | ICD-10-CM

## 2017-01-14 NOTE — Patient Instructions (Signed)
1. Endometrial biopsy is performed today 2. Return in 2 weeks for follow-up 3. Options for management of menorrhagia to anemia include:  Observation  Birth control pills for cycle regulation   Mirena IUD for cycle regulation  Depo-Lupron injections for induction of medical menopause and suppression of bleeding  D&C (surgery)  D&C with endometrial ablation (surgery)  Hysterectomy   Endometrial Biopsy, Care After Refer to this sheet in the next few weeks. These instructions provide you with information on caring for yourself after your procedure. Your health care provider may also give you more specific instructions. Your treatment has been planned according to current medical practices, but problems sometimes occur. Call your health care provider if you have any problems or questions after your procedure. WHAT TO EXPECT AFTER THE PROCEDURE After your procedure, it is typical to have the following:  You may have mild cramping and a small amount of vaginal bleeding for a few days after the procedure. This is normal. HOME CARE INSTRUCTIONS  Only take over-the-counter or prescription medicine as directed by your health care provider.  Do not douche, use tampons, or have sexual intercourse until your health care provider approves.  Follow your health care provider's instructions regarding any activity restrictions, such as strenuous exercise or heavy lifting. SEEK MEDICAL CARE IF:  You have heavy bleeding or bleeding longer than 2 days after the procedure.  You have bad smelling drainage from your vagina.  You have a fever and chills.  Youhave severe lower stomach (abdominal) pain. SEEK IMMEDIATE MEDICAL CARE IF:  You have severe cramps in your stomach or back.  You pass large blood clots.  Your bleeding increases.  You become weak or lightheaded, or you pass out. This information is not intended to replace advice given to you by your health care provider. Make sure you discuss  any questions you have with your health care provider. Document Released: 09/13/2013 Document Reviewed: 09/13/2013 Elsevier Interactive Patient Education  2017 [employer].

## 2017-01-14 NOTE — Progress Notes (Signed)
GYN ENCOUNTER NOTE  Subjective:       Julie Patel is a 51 y.o. G36P1011 female is here for gynecologic evaluation of the following issues:  1. Irregular bleeding.   2. Menorrhagia  3. Iron deficiency anemia due to chronic blood loss 4. Climacteric   Julie Patel is here today with complaints of heavy, irregular menses x 4 months. She has been experiencing 3 week-long cycles with bleeding ranging from light to heavy with large clots. She denies any pain, cramping, or dyspareunia; but she has been feeling fatigued. Denies fever, chills, nausea, vomiting. No change in bowel or urinary function. Her PCP started her on ferrous sulfate 325mg  TID. She is not using any contraception.   PelvicUltrasound from 12/11/2016: Uterus Measurements: 9.0 x 5.6 x 7.0 cm. There is a hypoechoic mass arising from the superior uterine fundus measuring 3.4 x 4.4 x 3.4 cm, consistent with a leiomyoma. No other uterine mass evident.  Endometrium Thickness: 10 mm. Endometrial contour is smooth. The echogenicity of the endometrium is mildly inhomogeneous.  Ovaries both showed normal appearance / no adnexal masses.  PROCEDURE Endometrial biopsy performed  Gynecologic History Patient's last menstrual period was 12/24/2016 (exact date). Contraception: none  Obstetric History OB History  Gravida Para Term Preterm AB Living  2 1 1   1 1   SAB TAB Ectopic Multiple Live Births    1     1    # Outcome Date GA Lbr Len/2nd Weight Sex Delivery Anes PTL Lv  2 Term 2003   6 lb (2.722 kg) M Vag-Spont   LIV  1 TAB 1990              Past Medical History:  Diagnosis Date  . Abnormal uterine bleeding   . Neck pain     Past Surgical History:  Procedure Laterality Date  . NO PAST SURGERIES      Current Outpatient Prescriptions on File Prior to Visit  Medication Sig Dispense Refill  . cyclobenzaprine (FLEXERIL) 10 MG tablet Take 1 tablet (10 mg total) by mouth at bedtime. (Patient taking differently: Take 10 mg  by mouth at bedtime as needed. ) 30 tablet 0  . ferrous sulfate (FERROUSUL) 325 (65 FE) MG tablet Take 1 tablet (325 mg total) by mouth 3 (three) times daily with meals. 90 tablet 3  . ibuprofen (ADVIL,MOTRIN) 600 MG tablet Take 1 tablet (600 mg total) by mouth every 8 (eight) hours as needed. 60 tablet 0   No current facility-administered medications on file prior to visit.     Allergies  Allergen Reactions  . Codeine Nausea And Vomiting    Social History   Social History  . Marital status: Married    Spouse name: N/A  . Number of children: N/A  . Years of education: N/A   Occupational History  . Not on file.   Social History Main Topics  . Smoking status: Never Smoker  . Smokeless tobacco: Never Used  . Alcohol use No  . Drug use: No  . Sexual activity: Not Currently   Other Topics Concern  . Not on file   Social History Narrative  . No narrative on file    Family History  Problem Relation Age of Onset  . Alzheimer's disease Mother   . Cancer Father     Prostate  . Hypertension Brother   . Alzheimer's disease Maternal Grandmother   . Cancer Maternal Grandfather     Lung  . Diabetes Paternal Grandmother   .  Stroke Paternal Grandfather   . Breast cancer Neg Hx   . Ovarian cancer Neg Hx   . Colon cancer Neg Hx     The following portions of the patient's history were reviewed and updated as appropriate: allergies, current medications, past family history, past medical history, past social history, past surgical history and problem list.  Review of Systems Review of Systems - General ROS: negative for - chills, fatigue, fever, hot flashes, malaise or night sweats Hematological and Lymphatic ROS: negative for - bleeding problems or swollen lymph nodes Gastrointestinal ROS: negative for - abdominal pain, blood in stools, change in bowel habits and nausea/vomiting Musculoskeletal ROS: negative for - joint pain, muscle pain or muscular weakness Genito-Urinary ROS:  Menorrhagia, irregular cycles. Negative for dyspareunia, dysuria, genital discharge, genital ulcers, incontinence, nocturia or pelvic pain  Objective:   BP 112/73   Pulse 80   Ht 5\' 4"  (1.626 m)   Wt 172 lb 6.4 oz (78.2 kg)   LMP 12/24/2016 (Exact Date)   BMI 29.59 kg/m  CONSTITUTIONAL: Well-developed, well-nourished female in no acute distress.  HENT:  Normocephalic, atraumatic.  NECK: Normal range of motion, supple, no masses.  Normal thyroid.  SKIN: Skin is warm and dry. No rash noted. Not diaphoretic. No erythema. No pallor. [name]: Alert and oriented to person, place, and time. PSYCHIATRIC: Normal mood and affect. Normal behavior. Normal judgment and thought content. CARDIOVASCULAR:Not Examined RESPIRATORY: Not Examined BREASTS: Not Examined ABDOMEN: Soft, non distended; Non tender.  No Organomegaly. PELVIC:  External Genitalia: Normal  BUS: Normal  Vagina: Normal  Cervix: Parous, no cervical motion tenderness, no lesions  Uterus: Midline, mobile, nontender; top felt normal size, leiomyoma not palpable possibly due to body habitus.  Adnexa: Normal  RV: Normal   Bladder: Nontender MUSCULOSKELETAL: Normal range of motion. No tenderness.  No cyanosis, clubbing, or edema.  PROCEDURE: Endometrial Biopsy Procedure Note  Pre-operative Diagnosis: Menorrhagia  Post-operative Diagnosis: Menorrhagia  Procedure Details   Urine pregnancy test was done  and result was negative.  The risks (including infection, bleeding, pain, and uterine perforation) and benefits of the procedure were explained to the patient and Verbal informed consent was obtained.  Antibiotic prophylaxis against endocarditis was not indicated.   The patient was placed in the dorsal lithotomy position.  Bimanual exam showed the uterus to be in the neutral position.  A Graves' speculum inserted in the vagina.  Endocervical curettage with a Kevorkian curette was not performed.   A sharp tenaculum was applied to  the anterior lip of the cervix for stabilization.  A sterile uterine sound was used to sound the uterus to a depth of 8cm.  A Mylex 2mm curette was used to sample the endometrium. 2 passes were made with production of abundant granular tissue. Sample was sent for pathologic examination.  Condition: Stable  Complications: None  Plan:  The patient was advised to call for any fever or for prolonged or severe pain or bleeding. She was advised to use OTC acetaminophen and OTC ibuprofen as needed for mild to moderate pain. She was advised to avoid vaginal intercourse for 48 hours or until the bleeding has completely stopped.  Attending Physician Documentation: Brayton Mars, MD      Assessment:   1. Uterine leiomyoma 2. Menorrhagia  3. Polymenorrhea  4. Iron deficiency anemia due to chronic blood loss   Plan:  1. Endometrial biopsy is performed today 2. Return in 2 weeks for follow-up 3. Discussed options for management of menorrhagia  to anemia including:  Observation with repeat endometrial biopsy every 6 months  Birth control pills for cycle regulation   Mirena IUD for cycle regulation  Depo-Lupron injections for induction of medical menopause and suppression of bleeding  D&C (surgery)  D&C with endometrial ablation (surgery)  Hysterectomy   Janeece Riggers, PA-S Brayton Mars, MD   I have seen, interviewed, and examined the patient in conjunction with the [facility]. student and affirm the diagnosis and management plan. Martin A. DeFrancesco, MD, FACOG   Note: This dictation was prepared with Dragon dictation along with smaller phrase technology. Any transcriptional errors that result from this process are unintentional.

## 2017-01-15 DIAGNOSIS — N920 Excessive and frequent menstruation with regular cycle: Secondary | ICD-10-CM | POA: Insufficient documentation

## 2017-01-15 DIAGNOSIS — D500 Iron deficiency anemia secondary to blood loss (chronic): Secondary | ICD-10-CM | POA: Insufficient documentation

## 2017-01-19 LAB — PATHOLOGY

## 2017-01-20 ENCOUNTER — Ambulatory Visit (INDEPENDENT_AMBULATORY_CARE_PROVIDER_SITE_OTHER): Payer: BC Managed Care – PPO | Admitting: Family Medicine

## 2017-01-20 ENCOUNTER — Encounter: Payer: Self-pay | Admitting: Family Medicine

## 2017-01-20 VITALS — BP 110/78 | HR 77 | Temp 99.0°F | Resp 17 | Ht 64.0 in | Wt 172.0 lb

## 2017-01-20 DIAGNOSIS — D500 Iron deficiency anemia secondary to blood loss (chronic): Secondary | ICD-10-CM | POA: Diagnosis not present

## 2017-01-20 DIAGNOSIS — N920 Excessive and frequent menstruation with regular cycle: Secondary | ICD-10-CM | POA: Diagnosis not present

## 2017-01-20 LAB — CBC WITH DIFFERENTIAL/PLATELET
HEMATOCRIT: 38.6 % (ref 34.0–46.6)
HEMOGLOBIN: 12.5 g/dL (ref 11.1–15.9)
LYMPHS ABS: 1.6 10*3/uL (ref 0.7–3.1)
Lymphs: 34 %
MCH: 26.8 pg (ref 26.6–33.0)
MCHC: 32.4 g/dL (ref 31.5–35.7)
MCV: 83 fL (ref 79–97)
MID (ABSOLUTE): 0.4 10*3/uL (ref 0.1–1.6)
MID: 7 %
Neutrophils Absolute: 2.8 10*3/uL (ref 1.4–7.0)
Neutrophils: 58 %
Platelets: 244 10*3/uL (ref 150–379)
RBC: 4.67 x10E6/uL (ref 3.77–5.28)
RDW: 15.3 % (ref 12.3–15.4)
WBC: 4.8 10*3/uL (ref 3.4–10.8)

## 2017-01-20 NOTE — Patient Instructions (Addendum)
Levonorgestrel intrauterine device (IUD) What is this medicine? LEVONORGESTREL IUD (LEE voe nor jes trel) is a contraceptive (birth control) device. The device is placed inside the uterus by a healthcare professional. It is used to prevent pregnancy. This device can also be used to treat heavy bleeding that occurs during your period. This medicine may be used for other purposes; ask your health care provider or pharmacist if you have questions. COMMON BRAND NAME(S): Kyleena, LILETTA, Mirena, Skyla What should I tell my health care provider before I take this medicine? They need to know if you have any of these conditions: -abnormal Pap smear -cancer of the breast, uterus, or cervix -diabetes -endometritis -genital or pelvic infection now or in the past -have more than one sexual partner or your partner has more than one partner -heart disease -history of an ectopic or tubal pregnancy -immune system problems -IUD in place -liver disease or tumor -problems with blood clots or take blood-thinners -seizures -use intravenous drugs -uterus of unusual shape -vaginal bleeding that has not been explained -an unusual or allergic reaction to levonorgestrel, other hormones, silicone, or polyethylene, medicines, foods, dyes, or preservatives -pregnant or trying to get pregnant -breast-feeding How should I use this medicine? This device is placed inside the uterus by a health care professional. Talk to your pediatrician regarding the use of this medicine in children. Special care may be needed. Overdosage: If you think you have taken too much of this medicine contact a poison control center or emergency room at once. NOTE: This medicine is only for you. Do not share this medicine with others. What if I miss a dose? This does not apply. Depending on the brand of device you have inserted, the device will need to be replaced every 3 to 5 years if you wish to continue using this type of birth  control. What may interact with this medicine? Do not take this medicine with any of the following medications: -amprenavir -bosentan -fosamprenavir This medicine may also interact with the following medications: -aprepitant -barbiturate medicines for inducing sleep or treating seizures -bexarotene -griseofulvin -medicines to treat seizures like carbamazepine, ethotoin, felbamate, oxcarbazepine, phenytoin, topiramate -modafinil -pioglitazone -rifabutin -rifampin -rifapentine -some medicines to treat HIV infection like atazanavir, indinavir, lopinavir, nelfinavir, tipranavir, ritonavir -St. John's wort -warfarin This list may not describe all possible interactions. Give your health care provider a list of all the medicines, herbs, non-prescription drugs, or dietary supplements you use. Also tell them if you smoke, drink alcohol, or use illegal drugs. Some items may interact with your medicine. What should I watch for while using this medicine? Visit your doctor or health care professional for regular check ups. See your doctor if you or your partner has sexual contact with others, becomes HIV positive, or gets a sexual transmitted disease. This product does not protect you against HIV infection (AIDS) or other sexually transmitted diseases. You can check the placement of the IUD yourself by reaching up to the top of your vagina with clean fingers to feel the threads. Do not pull on the threads. It is a good habit to check placement after each menstrual period. Call your doctor right away if you feel more of the IUD than just the threads or if you cannot feel the threads at all. The IUD may come out by itself. You may become pregnant if the device comes out. If you notice that the IUD has come out use a backup birth control method like condoms and call your health care provider.   Using tampons will not change the position of the IUD and are okay to use during your period. This IUD can be  safely scanned with magnetic resonance imaging (MRI) only under specific conditions. Before you have an MRI, tell your healthcare provider that you have an IUD in place, and which type of IUD you have in place. What side effects may I notice from receiving this medicine? Side effects that you should report to your doctor or health care professional as soon as possible: -allergic reactions like skin rash, itching or hives, swelling of the face, lips, or tongue -fever, flu-like symptoms -genital sores -high blood pressure -no menstrual period for 6 weeks during use -pain, swelling, warmth in the leg -pelvic pain or tenderness -severe or sudden headache -signs of pregnancy -stomach cramping -sudden shortness of breath -trouble with balance, talking, or walking -unusual vaginal bleeding, discharge -yellowing of the eyes or skin Side effects that usually do not require medical attention (report to your doctor or health care professional if they continue or are bothersome): -acne -breast pain -change in sex drive or performance -changes in weight -cramping, dizziness, or faintness while the device is being inserted -headache -irregular menstrual bleeding within first 3 to 6 months of use -nausea This list may not describe all possible side effects. Call your doctor for medical advice about side effects. You may report side effects to FDA at 1-800-FDA-1088. Where should I keep my medicine? This does not apply. NOTE: This sheet is a summary. It may not cover all possible information. If you have questions about this medicine, talk to your doctor, pharmacist, or health care provider.  2017 Elsevier/Gold Standard (2016-05-15 13:46:37)  Intrauterine Device Information Introduction An intrauterine device (IUD) is a medical device that gets inserted into the uterus to prevent pregnancy. It is a small, T-shaped device that has one or two nylon strings hanging down from it. The strings hang out  of the lower part of the uterus (cervix) to allow for future IUD removal. There are two types of IUDs available:  Copper IUD. This type of IUD has copper wire wrapped around it. A copper IUD may last up to 10 years.  Hormone IUD. This type of IUD is made of plastic and contains the hormone progestin (synthetic progesterone). A hormone IUD may last 3 to 5 years. IUDs are inserted through the vagina and placed into the uterus with a minor medical procedure. How does the IUD work? Copper in the copper IUD prevents pregnancy by making the uterus and fallopian tubes produce a fluid that kills sperm. Synthetic progesterone in hormonal IUD prevents pregnancy by:  Thickening cervical mucus to prevent sperm from entering the uterus.  Thinning the uterine lining to prevent implantation of a fertilized egg.  Weakening or killing sperm that get into the uterus. What are the advantages of an IUD?  IUDs are highly effective, reversible, long-acting, and low-maintenance.  There are no estrogen-related side effects.  An IUD can be used when breastfeeding.  IUDs are not associated with weight gain.  Advantages of the copper IUD are that:  It works immediately after insertion.  It does not interfere with your body's natural hormones.  It can be used for 10 years.  Advantages of the hormone IUD are that:  If it is inserted within 7 days of your period starting, it works immediately after insertion. If the hormone IUD is inserted at any other time in your cycle, you will need to use a backup method of  birth control for 7 days after insertion.  It can make menstrual periods lighter.  It can decrease menstrual cramping.  It can be used for 3 or 5 years. What are the disadvantages of an IUD?  The hormone IUD may cause irregular menstrual bleeding for a period of time after insertion.  The copper IUD can make your menstrual flow heavier and more painful.  You may experience some pain during  insertion, and cramping and vaginal bleeding after insertion. How is the IUD removed? Is the IUD right for me?  Your health care provider will make sure you are a good candidate for an IUD and will discuss side effects with you. This information is not intended to replace advice given to you by your health care provider. Make sure you discuss any questions you have with your health care provider. Document Released: 10/27/2004 Document Revised: 04/30/2016 Document Reviewed: 05/14/2013  2017 Elsevier Perimenopause Perimenopause is the time when your body begins to move into the menopause (no menstrual period for 12 straight months). It is a natural process. Perimenopause can begin 2-8 years before the menopause and usually lasts for 1 year after the menopause. During this time, your ovaries may or may not produce an egg. The ovaries vary in their production of estrogen and progesterone hormones each month. This can cause irregular menstrual periods, difficulty getting pregnant, vaginal bleeding between periods, and uncomfortable symptoms. CAUSES  Irregular production of the ovarian hormones, estrogen and progesterone, and not ovulating every month.  Other causes include:  Tumor of the pituitary gland in the brain.  Medical disease that affects the ovaries.  Radiation treatment.  Chemotherapy.  Unknown causes.  Heavy smoking and excessive alcohol intake can bring on perimenopause sooner. SIGNS AND SYMPTOMS   Hot flashes.  Night sweats.  Irregular menstrual periods.  Decreased sex drive.  Vaginal dryness.  Headaches.  Mood swings.  Depression.  Memory problems.  Irritability.  Tiredness.  Weight gain.  Trouble getting pregnant.  The beginning of losing bone cells (osteoporosis).  The beginning of hardening of the arteries (atherosclerosis). DIAGNOSIS  Your health care provider will make a diagnosis by analyzing your age, menstrual history, and symptoms. He or she  will do a physical exam and note any changes in your body, especially your female organs. Female hormone tests may or may not be helpful depending on the amount of female hormones you produce and when you produce them. However, other hormone tests may be helpful to rule out other problems. TREATMENT  In some cases, no treatment is needed. The decision on whether treatment is necessary during the perimenopause should be made by you and your health care provider based on how the symptoms are affecting you and your lifestyle. Various treatments are available, such as:  Treating individual symptoms with a specific medicine for that symptom.  Herbal medicines that can help specific symptoms.  Counseling.  Group therapy. HOME CARE INSTRUCTIONS   Keep track of your menstrual periods (when they occur, how heavy they are, how long between periods, and how long they last) as well as your symptoms and when they started.  Only take over-the-counter or prescription medicines as directed by your health care provider.  Sleep and rest.  Exercise.  Eat a diet that contains calcium (good for your bones) and soy (acts like the estrogen hormone).  Do not smoke.  Avoid alcoholic beverages.  Take vitamin supplements as recommended by your health care provider. Taking vitamin E may help in certain cases.  Take calcium and vitamin D supplements to help prevent bone loss.  Group therapy is sometimes helpful.  Acupuncture may help in some cases. SEEK MEDICAL CARE IF:   You have questions about any symptoms you are having.  You need a referral to a specialist (gynecologist, psychiatrist, or psychologist). SEEK IMMEDIATE MEDICAL CARE IF:   You have vaginal bleeding.  Your period lasts longer than 8 days.  Your periods are recurring sooner than 21 days.  You have bleeding after intercourse.  You have severe depression.  You have pain when you urinate.  You have severe headaches.  You have  vision problems. This information is not intended to replace advice given to you by your health care provider. Make sure you discuss any questions you have with your health care provider. Document Released: 12/31/2004 Document Revised: 12/14/2014 Document Reviewed: 06/22/2013 Elsevier Interactive Patient Education  2017 [place].  Dilation and Curettage or Vacuum Curettage Dilation and curettage (D&C) and vacuum curettage are minor procedures. A D&C involves stretching (dilation) the cervix and scraping (curettage) the inside lining of the uterus (endometrium). During a D&C, tissue is gently scraped from the endometrium, starting from the top portion of the uterus down to the lowest part of the uterus (cervix). During a vacuum curettage, the lining and tissue in the uterus are removed with the use of gentle suction. Curettage may be performed to either diagnose or treat a problem. As a diagnostic procedure, curettage is performed to examine tissues from the uterus. A diagnostic curettage may be done if you have:  Irregular bleeding in the uterus.  Bleeding with the development of clots.  Spotting between menstrual periods.  Prolonged menstrual periods or other abnormal bleeding.  Bleeding after menopause.  No menstrual period (amenorrhea).  A change in size and shape of the uterus.  Abnormal endometrial cells discovered during a Pap test. As a treatment procedure, curettage may be performed for the following reasons:  Removal of an IUD (intrauterine device).  Removal of retained placenta after giving birth.  Abortion.  Miscarriage.  Removal of endometrial polyps.  Removal of uncommon types of noncancerous lumps (fibroids). Tell a health care provider about:  Any allergies you have, including allergies to prescribed medicine or latex.  All medicines you are taking, including vitamins, herbs, eye drops, creams, and over-the-counter medicines. This is especially important  if you take any blood-thinning medicine. Bring a list of all of your medicines to your appointment.  Any problems you or family members have had with anesthetic medicines.  Any blood disorders you have.  Any surgeries you have had.  Your medical history and any medical conditions you have.  Whether you are pregnant or may be pregnant.  Recent vaginal infections you have had.  Recent menstrual periods, bleeding problems you have had, and what form of birth control (contraception) you use. What are the risks? Generally, this is a safe procedure. However, problems may occur, including:  Infection.  Heavy vaginal bleeding.  Allergic reactions to medicines.  Damage to the cervix or other structures or organs.  Development of scar tissue (adhesions) inside the uterus, which can cause abnormal amounts of menstrual bleeding. This may make it harder to get pregnant in the future.  A hole (perforation) or puncture in the uterine wall. This is rare. What happens before the procedure? Staying hydrated  Follow instructions from your health care provider about hydration, which may include:  Up to 2 hours before the procedure - you may continue to drink  clear liquids, such as water, clear fruit juice, black coffee, and plain tea. Eating and drinking restrictions  Follow instructions from your health care provider about eating and drinking, which may include:  8 hours before the procedure - stop eating heavy meals or foods such as meat, fried foods, or fatty foods.  6 hours before the procedure - stop eating light meals or foods, such as toast or cereal.  6 hours before the procedure - stop drinking milk or drinks that contain milk.  2 hours before the procedure - stop drinking clear liquids. If your health care provider told you to take your medicine(s) on the day of your procedure, take them with only a sip of water. Medicines  Ask your health care provider about:  Changing or  stopping your regular medicines. This is especially important if you are taking diabetes medicines or blood thinners.  Taking medicines such as aspirin and ibuprofen. These medicines can thin your blood. Do not take these medicines before your procedure if your health care provider instructs you not to.  You may be given antibiotic medicine to help prevent infection. General instructions  For 24 hours before your procedure, do not:  [place].  Use tampons.  Use medicines, creams, or suppositories in the vagina.  Have sexual intercourse.  You may be given a pregnancy test on the day of the procedure.  Plan to have someone take you home from the hospital or clinic.  You may have a blood or urine sample taken.  If you will be going home right after the procedure, plan to have someone with you for 24 hours. What happens during the procedure?  To reduce your risk of infection:  Your health care team will wash or sanitize their hands.  Your skin will be washed with soap.  An IV tube will be inserted into one of your veins.  You will be given one of the following:  A medicine that numbs the area in and around the cervix (local anesthetic).  A medicine to make you fall asleep (general anesthetic).  You will lie down on your back, with your feet in foot rests (stirrups).  The size and position of your uterus will be checked.  A lubricated instrument (speculum or Sims retractor) will be inserted into the back side of your vagina. The speculum will be used to hold apart the walls of your vagina so your health care provider can see your cervix.  A tool (tenaculum) will be attached to the lip of the cervix to stabilize it.  Your cervix will be softened and dilated. This may be done by:  Taking a medicine.  Having tapered dilators or thin rods (laminaria) or gradual widening instruments (tapered dilators) inserted into your cervix.  A small, sharp, curved instrument (curette)  will be used to scrape a small amount of tissue or cells from the endometrium or cervical canal. In some cases, gentle suction is applied with the curette. The curette will then be removed. The cells will be taken to a lab for testing. The procedure may vary among health care providers and hospitals. What happens after the procedure?  You may have mild cramping, backache, pain, and light bleeding or spotting. You may pass small blood clots from your vagina.  You may have to wear compression stockings. These stockings help to prevent blood clots and reduce swelling in your legs.  Your blood pressure, heart rate, breathing rate, and blood oxygen level will be monitored until the medicines  you were given have worn off. Summary  Dilation and curettage (D&C) involves stretching (dilation) the cervix and scraping (curettage) the inside lining of the uterus (endometrium).  After the procedure, you may have mild cramping, backache, pain, and light bleeding or spotting. You may pass small blood clots from your vagina.  Plan to have someone take you home from the hospital or clinic. This information is not intended to replace advice given to you by your health care provider. Make sure you discuss any questions you have with your health care provider. Document Released: 11/23/2005 Document Revised: 08/09/2016 Document Reviewed: 08/09/2016 Elsevier Interactive Patient Education  2017 [place].  Endometrial Ablation Endometrial ablation removes the lining of the uterus (endometrium). It is usually a same-day, outpatient treatment. Ablation helps avoid major surgery, such as surgery to remove the cervix and uterus (hysterectomy). After endometrial ablation, you will have little or no menstrual bleeding and may not be able to have children. However, if you are premenopausal, you will need to use a reliable method of birth control following the procedure because of the small chance that pregnancy can  occur. There are different reasons to have this procedure. These reasons include:  Heavy periods.  Bleeding that is causing anemia.  Irregular bleeding.  Bleeding fibroids on the lining inside the uterus if they are smaller than 3 centimeters. This procedure may not be possible for you if:   You want to have children in the future.   You have severe cramps with your menstrual period.   You have precancerous or cancerous cells in your uterus.   You were recently pregnant.   You have gone through menopause.   You have had major surgery on your uterus, resulting in thinning of the uterine wall. Surgeries may include:  The removal of one or more uterine fibroids (myomectomy).  A cesarean section with a classic (vertical) incision on your uterus. Ask your health care provider what type of cesarean you had. Sometimes the scar on your skin is different than the scar on your uterus. Even if you have had surgery on your uterus, certain types of ablation may still be safe for you. Talk with your health care provider. LET [facility] CARE PROVIDER KNOW ABOUT:  Any allergies you have.  All medicines you are taking, including vitamins, herbs, eye drops, creams, and over-the-counter medicines.  Previous problems you or members of your family have had with the use of anesthetics.  Any blood disorders you have.  Previous surgeries you have had.  Medical conditions you have. RISKS AND COMPLICATIONS  Generally, this is a safe procedure. However, as with any procedure, complications can occur. Possible complications include:  Perforation of the uterus.  Bleeding.  Infection of the uterus, bladder, or vagina.  Injury to surrounding organs.  An air bubble to the lung (air embolus).  Pregnancy following the procedure.  Failure of the procedure to help the problem, requiring hysterectomy.  Decreased ability to diagnose cancer in the lining of the uterus. BEFORE THE  PROCEDURE  The lining of the uterus must be tested to make sure there is no pre-cancerous or cancer cells present.  An ultrasound may be performed to look at the size of the uterus and to check for abnormalities.  Medicines may be given to thin the lining of the uterus. PROCEDURE  During the procedure, your health care provider will use a tool called a resectoscope to help see inside your uterus. There are different ways to remove the lining  of your uterus.   Radiofrequency - This method uses a radiofrequency-alternating electric current to remove the lining of the uterus.  Cryotherapy - This method uses extreme cold to freeze the lining of the uterus.  Heated-Free Liquid - This method uses heated salt (saline) solution to remove the lining of the uterus.  Microwave - This method uses high-energy microwaves to heat up the lining of the uterus to remove it.  Thermal balloon - This method involves inserting a catheter with a balloon tip into the uterus. The balloon tip is filled with heated fluid to remove the lining of the uterus. AFTER THE PROCEDURE  After your procedure, do not have sexual intercourse or insert anything into your vagina until permitted by your health care provider. After the procedure, you may experience:  Cramps.  Vaginal discharge.  Frequent urination. This information is not intended to replace advice given to you by your health care provider. Make sure you discuss any questions you have with your health care provider. Document Released: 10/02/2004 Document Revised: 08/14/2015 Document Reviewed: 04/26/2013 Elsevier Interactive Patient Education  2017 [employer].

## 2017-01-20 NOTE — Progress Notes (Signed)
BP 110/78 (BP Location: Left Arm, Patient Position: Sitting, Cuff Size: Large)   Pulse 77   Temp 99 F (37.2 C) (Oral)   Resp 17   Ht 5\' 4"  (1.626 m)   Wt 172 lb (78 kg)   LMP 12/24/2016 (Exact Date)   SpO2 98%   BMI 29.52 kg/m    Subjective:    Patient ID: Julie Patel, female    DOB: 1966/07/29, 51 y.o.   MRN: [id number]  HPI: Julie Patel is a 51 y.o. female  Chief Complaint  Patient presents with  . Abnormal uterine bleeding    Follow up   ANEMIA- Just saw the GYN and had endometrial biopsy, going to follow up with him in a couple of weeks to determine next steps, she is not sure what she is leaning towards yet. She is thinking she might want to do observation.  Anemia status: stable Etiology of anemia: chronic blood loss  Duration of anemia treatment: about 2 months Compliance with treatment: fair compliance Iron supplementation side effects: no Severity of anemia: moderate Fatigue: yes Decreased exercise tolerance: yes  Dyspnea on exertion: no Palpitations: no Bleeding: yes Pica: no  Relevant past medical, surgical, family and social history reviewed and updated as indicated. Interim medical history since our last visit reviewed. Allergies and medications reviewed and updated.  Review of Systems  Constitutional: Negative.   Respiratory: Negative.   Cardiovascular: Negative.   Genitourinary: Positive for menstrual problem, vaginal bleeding and vaginal discharge. Negative for decreased urine volume, difficulty urinating, dyspareunia, dysuria, enuresis, flank pain, frequency, genital sores, hematuria, pelvic pain, urgency and vaginal pain.  Psychiatric/Behavioral: Negative.     Per HPI unless specifically indicated above     Objective:    BP 110/78 (BP Location: Left Arm, Patient Position: Sitting, Cuff Size: Large)   Pulse 77   Temp 99 F (37.2 C) (Oral)   Resp 17   Ht 5\' 4"  (1.626 m)   Wt 172 lb (78 kg)   LMP 12/24/2016 (Exact Date)    SpO2 98%   BMI 29.52 kg/m   Wt Readings from Last 3 Encounters:  01/20/17 172 lb (78 kg)  01/14/17 172 lb 6.4 oz (78.2 kg)  12/02/16 168 lb 6.4 oz (76.4 kg)    Physical Exam  Constitutional: She is oriented to person, place, and time. She appears well-developed and well-nourished. No distress.  HENT:  Head: Normocephalic and atraumatic.  Right Ear: Hearing normal.  Left Ear: Hearing normal.  Nose: Nose normal.  Eyes: Conjunctivae and lids are normal. Right eye exhibits no discharge. Left eye exhibits no discharge. No scleral icterus.  Cardiovascular: Normal rate, regular rhythm, normal heart sounds and intact distal pulses.  Exam reveals no gallop and no friction rub.   No murmur heard. Pulmonary/Chest: Effort normal and breath sounds normal. No respiratory distress. She has no wheezes. She has no rales. She exhibits no tenderness.  Musculoskeletal: Normal range of motion.  Neurological: She is alert and oriented to person, place, and time.  Skin: Skin is warm, dry and intact. No rash noted. She is not diaphoretic. No erythema. No pallor.  Psychiatric: She has a normal mood and affect. Her speech is normal and behavior is normal. Judgment and thought content normal. Cognition and memory are normal.  Nursing note and vitals reviewed.   Results for orders placed or performed in visit on 01/14/17  Pathology  Result Value Ref Range   PATH REPORT.SITE OF ORIGIN SPEC Comment    .  Comment    PATH REPORT.FINAL DX SPEC Comment    SIGNED OUT BY: Comment    GROSS DESCRIPTION: Comment    MICROSCOPIC DESCRIPTION: Comment    . Comment    PAYMENT PROCEDURE Comment       Assessment & Plan:   Problem List Items Addressed This Visit      Other   Polymenorrhea    Will continue to follow with GYN. Unsure of path to go right now. Continue to monitor.       Iron deficiency anemia due to chronic blood loss - Primary    Will recheck levels today. Continue iron. Continue to monitor. Call with  any concerns.       Relevant Orders   CBC With Differential/Platelet   Iron and TIBC   Ferritin       Follow up plan: No Follow-up on file.

## 2017-01-20 NOTE — Assessment & Plan Note (Signed)
Will recheck levels today. Continue iron. Continue to monitor. Call with any concerns.

## 2017-01-20 NOTE — Assessment & Plan Note (Signed)
Will continue to follow with GYN. Unsure of path to go right now. Continue to monitor.

## 2017-01-21 LAB — IRON AND TIBC
IRON SATURATION: 51 % (ref 15–55)
Iron: 231 ug/dL — ABNORMAL HIGH (ref 27–159)
Total Iron Binding Capacity: 452 ug/dL — ABNORMAL HIGH (ref 250–450)
UIBC: 221 ug/dL (ref 131–425)

## 2017-01-21 LAB — FERRITIN: FERRITIN: 7 ng/mL — AB (ref 15–150)

## 2017-02-04 ENCOUNTER — Ambulatory Visit (INDEPENDENT_AMBULATORY_CARE_PROVIDER_SITE_OTHER): Payer: BC Managed Care – PPO | Admitting: Obstetrics and Gynecology

## 2017-02-04 ENCOUNTER — Encounter: Payer: Self-pay | Admitting: Obstetrics and Gynecology

## 2017-02-04 VITALS — BP 143/73 | HR 96 | Ht 64.0 in | Wt 170.1 lb

## 2017-02-04 DIAGNOSIS — D259 Leiomyoma of uterus, unspecified: Secondary | ICD-10-CM | POA: Diagnosis not present

## 2017-02-04 DIAGNOSIS — D500 Iron deficiency anemia secondary to blood loss (chronic): Secondary | ICD-10-CM

## 2017-02-04 DIAGNOSIS — N920 Excessive and frequent menstruation with regular cycle: Secondary | ICD-10-CM

## 2017-02-04 NOTE — Progress Notes (Signed)
Chief complaint: 1. Menorrhagia 2. History of deficiency anemia due to chronic blood loss 3. Climacteric 4. Irregular menstrual cycles  Patient presents for follow-up. Previous endometrial biopsy was obtained along with CBC. Patient continues to bleed since her biopsy on 01/20/2017  PATHOLOGY: Endometrial biopsy (01/20/2017) Diagnosis:  A. DISORDERED PROLIFERATIVE PHASE ENDOMETRIUM. NO EVIDENCE OF HYPERPLASIA  OR MALIGNANCY.  CBC Latest Ref Rng & Units 01/20/2017 12/02/2016 11/05/2016  WBC 3.4 - 10.8 x10E3/uL 4.8 4.2 4.4  Hematocrit 34.0 - 46.6 % 38.6 28.5(L) 32.9(L)  Platelets 150 - 379 x10E3/uL 244 221 249   Ultrasound 12/21/2016 Uterus Measurements: 9.0 x 5.6 x 7.0 cm. There is a hypoechoic mass arising from the superior uterine fundus measuring 3.4 x 4.4 x 3.4 cm, consistent with a leiomyoma. No other uterine mass evident.  Endometrium Thickness: 10 mm. Endometrial contour is smooth. The echogenicity of the endometrium is mildly inhomogeneous.  Ovaries both showed normal appearance / no adnexal masses.  OBJECTIVE: BP (!) 143/73   Pulse 96   Ht 5\' 4"  (1.626 m)   Wt 170 lb 1.6 oz (77.2 kg)   LMP 12/24/2016 (Exact Date)   BMI 29.20 kg/m  Pleasant female in no acute distress. Alert and oriented. Physical exam-deferred  ASSESSMENT: 1. Polymenorrhea 2. Iron deficiency anemia due to chronic blood loss; CBC on 01/20/2017 demonstrates no ongoing anemia 3. Uterine leiomyoma on ultrasound (fundus) 4. Desires conservative surgery  PLAN: 1. Both medical and surgical options were reviewed with the patient regarding management of menorrhagia to anemia 2. Hysteroscopy/D&C with NovaSure endometrial ablation is scheduled 3. Patient will return for preop appointment prior to surgery  A total of 25 minutes were spent face-to-face with the patient during this encounter and over half of that time involved counseling and coordination of care.  Brayton Mars, MD  Note: This  dictation was prepared with Dragon dictation along with smaller phrase technology. Any transcriptional errors that result from this process are unintentional.

## 2017-02-04 NOTE — Patient Instructions (Signed)
1. Hysteroscopy/D&C with NovaSure endometrial ablation was scheduled 2. Return in 1 week prior to surgery for preoperative appointment   Endometrial Ablation Endometrial ablation is a procedure that destroys the thin inner layer of the lining of the uterus (endometrium). This procedure may be done:  To stop heavy periods.  To stop bleeding that is causing anemia.  To control irregular bleeding.  To treat bleeding caused by small tumors (fibroids) in the endometrium. This procedure is often an alternative to major surgery, such as removal of the uterus and cervix (hysterectomy). As a result of this procedure:  You may not be able to have children. However, if you are premenopausal (you have not gone through menopause):  You may still have a small chance of getting pregnant.  You will need to use a reliable method of birth control after the procedure to prevent pregnancy.  You may stop having a menstrual period, or you may have only a small amount of bleeding during your period. Menstruation may return several years after the procedure. Tell a health care provider about:  Any allergies you have.  All medicines you are taking, including vitamins, herbs, eye drops, creams, and over-the-counter medicines.  Any problems you or family members have had with the use of anesthetic medicines.  Any blood disorders you have.  Any surgeries you have had.  Any medical conditions you have. What are the risks? Generally, this is a safe procedure. However, problems may occur, including:  A hole (perforation) in the uterus or bowel.  Infection of the uterus, bladder, or vagina.  Bleeding.  Damage to other structures or organs.  An air bubble in the lung (air embolus).  Problems with pregnancy after the procedure.  Failure of the procedure.  Decreased ability to diagnose cancer in the endometrium. What happens before the procedure?  You will have tests of your endometrium to make  sure there are no pre-cancerous cells or cancer cells present.  You may have an ultrasound of the uterus.  You may be given medicines to thin the endometrium.  Ask your health care provider about:  Changing or stopping your regular medicines. This is especially important if you take diabetes medicines or blood thinners.  Taking medicines such as aspirin and ibuprofen. These medicines can thin your blood. Do not take these medicines before your procedure if your doctor tells you not to.  Plan to have someone take you home from the hospital or clinic. What happens during the procedure?  You will lie on an exam table with your feet and legs supported as in a pelvic exam.  To lower your risk of infection:  Your health care team will wash or sanitize their hands and put on germ-free (sterile) gloves.  Your genital area will be washed with soap.  An IV tube will be inserted into one of your veins.  You will be given a medicine to help you relax (sedative).  A surgical instrument with a light and camera (resectoscope) will be inserted into your vagina and moved into your uterus. This allows your surgeon to see inside your uterus.  Endometrial tissue will be removed using one of the following methods:  Radiofrequency. This method uses a radiofrequency-alternating electric current to remove the endometrium.  Cryotherapy. This method uses extreme cold to freeze the endometrium.  Heated-free liquid. This method uses a heated saltwater (saline) solution to remove the endometrium.  Microwave. This method uses high-energy microwaves to heat up the endometrium and remove it.  Thermal  balloon. This method involves inserting a catheter with a balloon tip into the uterus. The balloon tip is filled with heated fluid to remove the endometrium. The procedure may vary among health care providers and hospitals. What happens after the procedure?  Your blood pressure, heart rate, breathing rate,  and blood oxygen level will be monitored until the medicines you were given have worn off.  As tissue healing occurs, you may notice vaginal bleeding for 4-6 weeks after the procedure. You may also experience:  Cramps.  Thin, watery vaginal discharge that is light pink or brown in color.  A need to urinate more frequently than usual.  Nausea.  Do not drive for 24 hours if you were given a sedative.  Do not have sex or insert anything into your vagina until your health care provider approves. Summary  Endometrial ablation is done to treat the many causes of heavy menstrual bleeding.  The procedure may be done only after medications have been tried to control the bleeding.  Plan to have someone take you home from the hospital or clinic. This information is not intended to replace advice given to you by your health care provider. Make sure you discuss any questions you have with your health care provider. Document Released: 10/02/2004 Document Revised: 12/10/2016 Document Reviewed: 12/10/2016 Elsevier Interactive Patient Education  2017 [employer].

## 2017-02-05 ENCOUNTER — Encounter: Payer: Self-pay | Admitting: Family Medicine

## 2017-02-05 NOTE — Telephone Encounter (Signed)
Called patient and discussed her iron and CBC results. She notes that her period is still really heavy. She was getting tired so she started her iron TID again. Feeling better now. She notes that because she is feeling worse again, she is going to have an ablation. Discussed with her, she is happy with this plan.

## 2017-02-08 ENCOUNTER — Encounter: Payer: Self-pay | Admitting: Family Medicine

## 2017-03-02 ENCOUNTER — Ambulatory Visit (INDEPENDENT_AMBULATORY_CARE_PROVIDER_SITE_OTHER): Payer: BC Managed Care – PPO | Admitting: Obstetrics and Gynecology

## 2017-03-02 ENCOUNTER — Encounter: Payer: Self-pay | Admitting: Obstetrics and Gynecology

## 2017-03-02 ENCOUNTER — Encounter
Admission: RE | Admit: 2017-03-02 | Discharge: 2017-03-02 | Disposition: A | Discharge status: Home / Self Care | Payer: BC Managed Care – PPO | Source: Ambulatory Visit | Attending: Obstetrics and Gynecology | Admitting: Obstetrics and Gynecology

## 2017-03-02 VITALS — BP 104/65 | HR 80 | Ht 64.0 in | Wt 171.5 lb

## 2017-03-02 DIAGNOSIS — Z01818 Encounter for other preprocedural examination: Secondary | ICD-10-CM | POA: Insufficient documentation

## 2017-03-02 DIAGNOSIS — D500 Iron deficiency anemia secondary to blood loss (chronic): Secondary | ICD-10-CM | POA: Diagnosis not present

## 2017-03-02 DIAGNOSIS — N920 Excessive and frequent menstruation with regular cycle: Secondary | ICD-10-CM

## 2017-03-02 LAB — CBC WITH DIFFERENTIAL/PLATELET
BASOS PCT: 1 %
Basophils Absolute: 0 10*3/uL (ref 0–0.1)
Eosinophils Absolute: 0.1 10*3/uL (ref 0–0.7)
Eosinophils Relative: 1 %
HCT: 32.5 % — ABNORMAL LOW (ref 35.0–47.0)
HEMOGLOBIN: 10.7 g/dL — AB (ref 12.0–16.0)
LYMPHS PCT: 32 %
Lymphs Abs: 1.3 10*3/uL (ref 1.0–3.6)
MCH: 28.2 pg (ref 26.0–34.0)
MCHC: 33 g/dL (ref 32.0–36.0)
MCV: 85.6 fL (ref 80.0–100.0)
MONOS PCT: 6 %
Monocytes Absolute: 0.3 10*3/uL (ref 0.2–0.9)
NEUTROS PCT: 60 %
Neutro Abs: 2.5 10*3/uL (ref 1.4–6.5)
Platelets: 258 10*3/uL (ref 150–440)
RBC: 3.79 MIL/uL — ABNORMAL LOW (ref 3.80–5.20)
RDW: 18.4 % — ABNORMAL HIGH (ref 11.5–14.5)
WBC: 4.2 10*3/uL (ref 3.6–11.0)

## 2017-03-02 LAB — RAPID HIV SCREEN (HIV 1/2 AB+AG)
HIV 1/2 ANTIBODIES: NONREACTIVE
HIV-1 P24 ANTIGEN - HIV24: NONREACTIVE

## 2017-03-02 NOTE — Progress Notes (Signed)
Subjective:  PREOPERATIVE HISTORY AND PHYSICAL   Date of surgery: 03/08/2017 Diagnoses: 1. Menorrhagia; polymenorrhea 2. History of iron deficiency anemia due to chronic blood loss 3.  History of uterine leiomyoma    Patient is a 51 y.o. G2P1056female scheduled for hysteroscopy/D&C with NovaSure endometrial ablation on 03/08/2017. Preoperative workup includes:  PATHOLOGY: Endometrial biopsy (01/20/2017) Diagnosis:  A. DISORDERED PROLIFERATIVE PHASE ENDOMETRIUM. NO EVIDENCE OF HYPERPLASIA  OR MALIGNANCY.  CBC Latest Ref Rng & Units 01/20/2017 12/02/2016 11/05/2016  WBC 3.4 - 10.8 x10E3/uL 4.8 4.2 4.4  Hematocrit 34.0 - 46.6 % 38.6 28.5(L) 32.9(L)  Platelets 150 - 379 x10E3/uL 244 221 249   Ultrasound 12/21/2016 Uterus Measurements: 9.0 x 5.6 x 7.0 cm. There is a hypoechoic mass arising from the superior uterine fundus measuring 3.4 x 4.4 x 3.4 cm, consistent with a leiomyoma. No other uterine mass evident.  Endometrium Thickness: 10 mm. Endometrial contour is smooth. The echogenicity of the endometrium is mildly inhomogeneous.  Ovaries both showed normal appearance / no adnexal masses.   Pertinent Gynecological History: Patient's last menstrual period was 02/17/2017 (exact date). Contraception: none  OB History    Gravida Para Term Preterm AB Living   2 1 1   1 1    SAB TAB Ectopic Multiple Live Births     1     1      Patient's last menstrual period was 02/17/2017 (approximate).    Past Medical History:  Diagnosis Date  . Abnormal uterine bleeding   . Neck pain     Past Surgical History:  Procedure Laterality Date  . NO PAST SURGERIES      OB History  Gravida Para Term Preterm AB Living  2 1 1   1 1   SAB TAB Ectopic Multiple Live Births    1     1    # Outcome Date GA Lbr Len/2nd Weight Sex Delivery Anes PTL Lv  2 Term 2003   6 lb (2.722 kg) M Vag-Spont   LIV  1 TAB 1990              Social History   Social History  . Marital status: Married     Spouse name: N/A  . Number of children: N/A  . Years of education: N/A   Social History Main Topics  . Smoking status: Never Smoker  . Smokeless tobacco: Never Used  . Alcohol use No  . Drug use: No  . Sexual activity: Not Currently   Other Topics Concern  . None   Social History Narrative  . None    Family History  Problem Relation Age of Onset  . Alzheimer's disease Mother   . Cancer Father     Prostate  . Hypertension Brother   . Alzheimer's disease Maternal Grandmother   . Cancer Maternal Grandfather     Lung  . Diabetes Paternal Grandmother   . Stroke Paternal Grandfather   . Breast cancer Neg Hx   . Ovarian cancer Neg Hx   . Colon cancer Neg Hx      (Not in a hospital admission)  Allergies  Allergen Reactions  . Codeine Nausea And Vomiting    Review of Systems Constitutional: No recent fever/chills/sweats Respiratory: No recent cough/bronchitis Cardiovascular: No chest pain Gastrointestinal: No recent nausea/vomiting/diarrhea Genitourinary: No UTI symptoms Hematologic/lymphatic:No history of coagulopathy or recent blood thinner use    Objective:    BP 104/65   Pulse 80   Ht 5\' 4"  (1.626 m)  Wt 171 lb 8 oz (77.8 kg)   LMP 02/17/2017 (Approximate)   BMI 29.44 kg/m   General:   Normal  Skin:   normal  HEENT:  Normal  Neck:  Supple without Adenopathy or Thyromegaly  Lungs:   Heart:              Breasts:   Abdomen:  Pelvis:  M/S   Extremeties:  Neuro:    clear to auscultation bilaterally   Normal without murmur   Not Examined   soft, non-tender; bowel sounds normal; no masses,  no organomegaly   Exam deferred to OR  No CVAT  Warm/Dry   Normal        2/80/2018 PELVIC:             External Genitalia: Normal             BUS: Normal             Vagina: Normal             Cervix: Parous, no cervical motion tenderness, no lesions             Uterus: Midline, mobile, nontender; top felt normal size, leiomyoma not palpable  possibly due to body habitus.             Adnexa: Normal             RV: Normal              Bladder: Nontender  Assessment:    1. Menorrhagia 2. Polymenorrhea 3. Iron deficiency anemia due to chronic blood loss 4. Uterine leiomyoma   Plan:   Hysteroscopy/D&C with NovaSure endometrial ablation   Preoperative counseling: The patient is to undergo hysteroscopy/D&C with NovaSure endometrial ablation on 03/08/2017. She is understanding of the planned procedures and is aware of and is accepting of all surgical risks which include but are not limited to bleeding, infection, pelvic organ injury with need for repair, blood clot disorders, anesthesia risks, etc. All questions are answered. Informed consent is given. Patient is ready and willing to proceed with surgery as scheduled.  Brayton Mars, MD  Note: This dictation was prepared with Dragon dictation along with smaller phrase technology. Any transcriptional errors that result from this process are unintentional.

## 2017-03-02 NOTE — Patient Instructions (Signed)
1. Return in 1 week after surgery for postop check on 03/17/2017

## 2017-03-02 NOTE — H&P (Signed)
Subjective:  PREOPERATIVE HISTORY AND PHYSICAL   Date of surgery: 03/08/2017 Diagnoses: 1. Menorrhagia; polymenorrhea 2. History of iron deficiency anemia due to chronic blood loss 3.  History of uterine leiomyoma    Patient is a 51 y.o. G2P1095female scheduled for hysteroscopy/D&C with NovaSure endometrial ablation on 03/08/2017. Preoperative workup includes:  PATHOLOGY: Endometrial biopsy (01/20/2017) Diagnosis:  A. DISORDERED PROLIFERATIVE PHASE ENDOMETRIUM. NO EVIDENCE OF HYPERPLASIA  OR MALIGNANCY.  CBC Latest Ref Rng & Units 01/20/2017 12/02/2016 11/05/2016  WBC 3.4 - 10.8 x10E3/uL 4.8 4.2 4.4  Hematocrit 34.0 - 46.6 % 38.6 28.5(L) 32.9(L)  Platelets 150 - 379 x10E3/uL 244 221 249   Ultrasound 12/21/2016 Uterus Measurements: 9.0 x 5.6 x 7.0 cm. There is a hypoechoic mass arising from the superior uterine fundus measuring 3.4 x 4.4 x 3.4 cm, consistent with a leiomyoma. No other uterine mass evident.  Endometrium Thickness: 10 mm. Endometrial contour is smooth. The echogenicity of the endometrium is mildly inhomogeneous.  Ovaries both showed normal appearance / no adnexal masses.   Pertinent Gynecological History: Patient's last menstrual period was 02/17/2017 (exact date). Contraception: none  OB History    Gravida Para Term Preterm AB Living   2 1 1   1 1    SAB TAB Ectopic Multiple Live Births     1     1      Patient's last menstrual period was 02/17/2017 (approximate).    Past Medical History:  Diagnosis Date  . Abnormal uterine bleeding   . Neck pain     Past Surgical History:  Procedure Laterality Date  . NO PAST SURGERIES      OB History  Gravida Para Term Preterm AB Living  2 1 1   1 1   SAB TAB Ectopic Multiple Live Births    1     1    # Outcome Date GA Lbr Len/2nd Weight Sex Delivery Anes PTL Lv  2 Term 2003   6 lb (2.722 kg) M Vag-Spont   LIV  1 TAB 1990              Social History   Social History  . Marital status: Married     Spouse name: N/A  . Number of children: N/A  . Years of education: N/A   Social History Main Topics  . Smoking status: Never Smoker  . Smokeless tobacco: Never Used  . Alcohol use No  . Drug use: No  . Sexual activity: Not Currently   Other Topics Concern  . None   Social History Narrative  . None    Family History  Problem Relation Age of Onset  . Alzheimer's disease Mother   . Cancer Father     Prostate  . Hypertension Brother   . Alzheimer's disease Maternal Grandmother   . Cancer Maternal Grandfather     Lung  . Diabetes Paternal Grandmother   . Stroke Paternal Grandfather   . Breast cancer Neg Hx   . Ovarian cancer Neg Hx   . Colon cancer Neg Hx      (Not in a hospital admission)  Allergies  Allergen Reactions  . Codeine Nausea And Vomiting    Review of Systems Constitutional: No recent fever/chills/sweats Respiratory: No recent cough/bronchitis Cardiovascular: No chest pain Gastrointestinal: No recent nausea/vomiting/diarrhea Genitourinary: No UTI symptoms Hematologic/lymphatic:No history of coagulopathy or recent blood thinner use    Objective:    BP 104/65   Pulse 80   Ht 5\' 4"  (1.626 m)  Wt 171 lb 8 oz (77.8 kg)   LMP 02/17/2017 (Approximate)   BMI 29.44 kg/m   General:   Normal  Skin:   normal  HEENT:  Normal  Neck:  Supple without Adenopathy or Thyromegaly  Lungs:   Heart:              Breasts:   Abdomen:  Pelvis:  M/S   Extremeties:  Neuro:    clear to auscultation bilaterally   Normal without murmur   Not Examined   soft, non-tender; bowel sounds normal; no masses,  no organomegaly   Exam deferred to OR  No CVAT  Warm/Dry   Normal        2/80/2018 PELVIC:             External Genitalia: Normal             BUS: Normal             Vagina: Normal             Cervix: Parous, no cervical motion tenderness, no lesions             Uterus: Midline, mobile, nontender; top felt normal size, leiomyoma not palpable  possibly due to body habitus.             Adnexa: Normal             RV: Normal              Bladder: Nontender  Assessment:    1. Menorrhagia 2. Polymenorrhea 3. Iron deficiency anemia due to chronic blood loss 4. Uterine leiomyoma   Plan:   Hysteroscopy/D&C with NovaSure endometrial ablation   Preoperative counseling: The patient is to undergo hysteroscopy/D&C with NovaSure endometrial ablation on 03/08/2017. She is understanding of the planned procedures and is aware of and is accepting of all surgical risks which include but are not limited to bleeding, infection, pelvic organ injury with need for repair, blood clot disorders, anesthesia risks, etc. All questions are answered. Informed consent is given. Patient is ready and willing to proceed with surgery as scheduled.  Brayton Mars, MD  Note: This dictation was prepared with Dragon dictation along with smaller phrase technology. Any transcriptional errors that result from this process are unintentional.

## 2017-03-03 ENCOUNTER — Encounter
Admission: RE | Admit: 2017-03-03 | Discharge: 2017-03-03 | Disposition: A | Discharge status: Home / Self Care | Payer: BC Managed Care – PPO | Source: Ambulatory Visit | Attending: Obstetrics and Gynecology | Admitting: Obstetrics and Gynecology

## 2017-03-03 HISTORY — DX: Anemia, unspecified: D64.9

## 2017-03-03 HISTORY — DX: Gastro-esophageal reflux disease without esophagitis: K21.9

## 2017-03-03 LAB — RPR: RPR Ser Ql: NONREACTIVE

## 2017-03-03 NOTE — Patient Instructions (Signed)
  Your procedure is scheduled on: 03-08-17 Report to Same Day Surgery 2nd floor medical mall [facility] Entrance-take elevator on left to 2nd floor.  Check in with surgery information desk.) To find out your arrival time please call [phone] between 1PM - 3PM on 03-05-17  Remember: Instructions that are not followed completely may result in serious medical risk, up to and including death, or upon the discretion of your surgeon and anesthesiologist your surgery may need to be rescheduled.    _x___ 1. Do not eat food or drink liquids after midnight. No gum chewing or  hard candies.     __x__ 2. No Alcohol for 24 hours before or after surgery.   __x__3. No Smoking for 24 prior to surgery.   ____  4. Bring all medications with you on the day of surgery if instructed.    __x__ 5. Notify your doctor if there is any change in your medical condition     (cold, fever, infections).     Do not wear jewelry, make-up, hairpins, clips or nail polish.  Do not wear lotions, powders, or perfumes. You may wear deodorant.  Do not shave 48 hours prior to surgery. Men may shave face and neck.  Do not bring valuables to the hospital.    [facility] is not responsible for any belongings or valuables.               Contacts, dentures or bridgework may not be worn into surgery.  Leave your suitcase in the car. After surgery it may be brought to your room.  For patients admitted to the hospital, discharge time is determined by your treatment team.   Patients discharged the day of surgery will not be allowed to drive home.  You will need someone to drive you home and stay with you the night of your procedure.    Please read over the following fact sheets that you were given:     ____ Take anti-hypertensive (unless it includes a diuretic), cardiac, seizure, asthma,     anti-reflux and psychiatric medicines. These include:  1. NONE  2.  3.  4.  5.  6.  ____Fleets enema or Magnesium Citrate as  directed.   ____ Use CHG Soap or sage wipes as directed on instruction sheet   ____ Use inhalers on the day of surgery and bring to hospital day of surgery  ____ Stop Metformin and Janumet 2 days prior to surgery.    ____ Take 1/2 of usual insulin dose the night before surgery and none on the morning     surgery.   ____ Follow recommendations from Cardiologist, Pulmonologist or PCP regarding          stopping Aspirin, Coumadin, Pllavix ,Eliquis, Effient, or Pradaxa, and Pletal.  X____Stop Anti-inflammatories such as Advil, Aleve, IBUPROFEN, Motrin, Naproxen, Naprosyn, Goodies powders or aspirin products NOW-OK to take Tylenol    ____ Stop supplements until after surgery.     ____ Bring C-Pap to the hospital.

## 2017-03-05 ENCOUNTER — Telehealth: Payer: Self-pay | Admitting: Family Medicine

## 2017-03-05 NOTE — Telephone Encounter (Signed)
Pt called and stated that she is scheduled for surgery Monday and she is starting to feel sick and she would like to know if she would be ok for surgery.

## 2017-03-05 NOTE — Telephone Encounter (Signed)
Let patient know that as long as her breathing is okay and no fever she should be fine. She stated she had talked to hospital and anesthesiologist will check her breathing and temperature before procedure to make sure she is okay to have procedure.

## 2017-03-05 NOTE — Telephone Encounter (Signed)
Patient is scheduled for a D&C on Monday, April 2nd. She is feeling under the weather with scratchy throat and cough onset yesterday. Not feeling too bad yet but says it has gone through her family and if she does get worse will they proceed with the procedure. Stated she would call the place where having procedure done to check with them but wanted to get Dr. Durenda Age input.

## 2017-03-05 NOTE — Telephone Encounter (Signed)
As long as she is breathing OK and is not running a fever, she should be fine!

## 2017-03-07 MED ORDER — FAMOTIDINE 20 MG PO TABS
20.0000 mg | ORAL_TABLET | Freq: Once | ORAL | Status: AC
  Administered 2017-03-08: 20 mg via ORAL

## 2017-03-08 ENCOUNTER — Encounter
Admission: RE | Disposition: A | Discharge status: Home / Self Care | Payer: Self-pay | Source: Ambulatory Visit | Attending: Obstetrics and Gynecology

## 2017-03-08 ENCOUNTER — Ambulatory Visit
Admission: RE | Admit: 2017-03-08 | Discharge: 2017-03-08 | Disposition: A | Discharge status: Home / Self Care | Payer: BC Managed Care – PPO | Source: Ambulatory Visit | Attending: Obstetrics and Gynecology | Admitting: Obstetrics and Gynecology

## 2017-03-08 ENCOUNTER — Encounter: Payer: Self-pay | Admitting: Anesthesiology

## 2017-03-08 ENCOUNTER — Ambulatory Visit: Payer: BC Managed Care – PPO | Admitting: Anesthesiology

## 2017-03-08 DIAGNOSIS — D259 Leiomyoma of uterus, unspecified: Secondary | ICD-10-CM

## 2017-03-08 DIAGNOSIS — D500 Iron deficiency anemia secondary to blood loss (chronic): Secondary | ICD-10-CM | POA: Insufficient documentation

## 2017-03-08 DIAGNOSIS — N920 Excessive and frequent menstruation with regular cycle: Secondary | ICD-10-CM | POA: Diagnosis not present

## 2017-03-08 DIAGNOSIS — Z9889 Other specified postprocedural states: Secondary | ICD-10-CM

## 2017-03-08 HISTORY — PX: DILITATION & CURRETTAGE/HYSTROSCOPY WITH NOVASURE ABLATION: SHX5568

## 2017-03-08 LAB — TYPE AND SCREEN
ABO/RH(D): O POS
Antibody Screen: NEGATIVE

## 2017-03-08 LAB — POCT PREGNANCY, URINE: PREG TEST UR: NEGATIVE

## 2017-03-08 LAB — ABO/RH: ABO/RH(D): O POS

## 2017-03-08 SURGERY — DILATATION & CURETTAGE/HYSTEROSCOPY WITH NOVASURE ABLATION
Anesthesia: General | Wound class: Clean Contaminated

## 2017-03-08 MED ORDER — LIDOCAINE HCL (CARDIAC) 20 MG/ML IV SOLN
INTRAVENOUS | Status: DC | PRN
Start: 2017-03-08 — End: 2017-03-08
  Administered 2017-03-08: 60 mg via INTRAVENOUS

## 2017-03-08 MED ORDER — FENTANYL CITRATE (PF) 100 MCG/2ML IJ SOLN
INTRAMUSCULAR | Status: DC | PRN
  Administered 2017-03-08: 25 ug via INTRAVENOUS

## 2017-03-08 MED ORDER — PROPOFOL 10 MG/ML IV BOLUS
INTRAVENOUS | Status: AC
  Filled 2017-03-08: qty 20

## 2017-03-08 MED ORDER — ONDANSETRON HCL 4 MG/2ML IJ SOLN
INTRAMUSCULAR | Status: DC | PRN
  Administered 2017-03-08: 4 mg via INTRAVENOUS

## 2017-03-08 MED ORDER — DEXAMETHASONE SODIUM PHOSPHATE 10 MG/ML IJ SOLN
INTRAMUSCULAR | Status: AC
  Filled 2017-03-08: qty 1

## 2017-03-08 MED ORDER — ONDANSETRON HCL 4 MG/2ML IJ SOLN
INTRAMUSCULAR | Status: AC
  Filled 2017-03-08: qty 2

## 2017-03-08 MED ORDER — ONDANSETRON HCL 4 MG/2ML IJ SOLN: 4.0000 mg | Freq: Once | INTRAMUSCULAR | Status: DC | PRN

## 2017-03-08 MED ORDER — FENTANYL CITRATE (PF) 100 MCG/2ML IJ SOLN
25.0000 ug | INTRAMUSCULAR | Status: DC | PRN
  Administered 2017-03-08 (×4): 25 ug via INTRAVENOUS

## 2017-03-08 MED ORDER — LACTATED RINGERS IV SOLN
INTRAVENOUS | Status: DC
  Administered 2017-03-08: 11:00:00 via INTRAVENOUS

## 2017-03-08 MED ORDER — LIDOCAINE HCL (PF) 2 % IJ SOLN
INTRAMUSCULAR | Status: AC
  Filled 2017-03-08: qty 2

## 2017-03-08 MED ORDER — GLYCOPYRROLATE 0.2 MG/ML IJ SOLN
INTRAMUSCULAR | Status: DC | PRN
  Administered 2017-03-08: 0.2 mg via INTRAVENOUS

## 2017-03-08 MED ORDER — FENTANYL CITRATE (PF) 100 MCG/2ML IJ SOLN
INTRAMUSCULAR | Status: AC
  Filled 2017-03-08: qty 2

## 2017-03-08 MED ORDER — MIDAZOLAM HCL 2 MG/2ML IJ SOLN
INTRAMUSCULAR | Status: DC | PRN
  Administered 2017-03-08: 2 mg via INTRAVENOUS

## 2017-03-08 MED ORDER — OXYCODONE-ACETAMINOPHEN 5-325 MG PO TABS: 1.0000 | ORAL_TABLET | ORAL | 0 refills | Status: DC | PRN

## 2017-03-08 MED ORDER — MIDAZOLAM HCL 2 MG/2ML IJ SOLN
INTRAMUSCULAR | Status: AC
  Filled 2017-03-08: qty 2

## 2017-03-08 MED ORDER — LACTATED RINGERS IV SOLN: INTRAVENOUS | Status: DC

## 2017-03-08 MED ORDER — PROPOFOL 10 MG/ML IV BOLUS
INTRAVENOUS | Status: DC | PRN
  Administered 2017-03-08: 200 mg via INTRAVENOUS

## 2017-03-08 MED ORDER — DEXAMETHASONE SODIUM PHOSPHATE 10 MG/ML IJ SOLN
INTRAMUSCULAR | Status: DC | PRN
  Administered 2017-03-08: 4 mg via INTRAVENOUS

## 2017-03-08 MED ORDER — IBUPROFEN 800 MG PO TABS: 800.0000 mg | ORAL_TABLET | Freq: Three times a day (TID) | ORAL | 1 refills | Status: DC

## 2017-03-08 MED ORDER — FAMOTIDINE 20 MG PO TABS
ORAL_TABLET | ORAL | Status: AC
  Filled 2017-03-08: qty 1

## 2017-03-08 MED ORDER — FENTANYL CITRATE (PF) 100 MCG/2ML IJ SOLN
INTRAMUSCULAR | Status: AC
  Administered 2017-03-08: 25 ug via INTRAVENOUS
  Filled 2017-03-08: qty 2

## 2017-03-08 SURGICAL SUPPLY — 12 items
CATH ROBINSON RED A/P 16FR (CATHETERS) ×2 IMPLANT
GLOVE BIO SURGEON STRL SZ8 (GLOVE) ×2 IMPLANT
GOWN STRL REUS W/ TWL LRG LVL3 (GOWN DISPOSABLE) ×1 IMPLANT
GOWN STRL REUS W/ TWL XL LVL3 (GOWN DISPOSABLE) ×1 IMPLANT
GOWN STRL REUS W/TWL LRG LVL3 (GOWN DISPOSABLE) ×1
GOWN STRL REUS W/TWL XL LVL3 (GOWN DISPOSABLE) ×1
IV LACTATED RINGERS 1000ML (IV SOLUTION) ×2 IMPLANT
KIT RM TURNOVER CYSTO AR (KITS) ×2 IMPLANT
NOVASURE ENDOMETRIAL ABLATION (MISCELLANEOUS) ×2 IMPLANT
PACK DNC HYST (MISCELLANEOUS) ×2 IMPLANT
PAD OB MATERNITY 4.3X12.25 (PERSONAL CARE ITEMS) ×2 IMPLANT
PAD PREP 24X41 OB/GYN DISP (PERSONAL CARE ITEMS) ×2 IMPLANT

## 2017-03-08 NOTE — Anesthesia Procedure Notes (Signed)
Procedure Name: LMA Insertion Performed by: Kierstyn Baranowski Pre-anesthesia Checklist: Patient identified, Patient being monitored, Timeout performed, Emergency Drugs available and Suction available Patient Re-evaluated:Patient Re-evaluated prior to inductionOxygen Delivery Method: Circle system utilized Preoxygenation: Pre-oxygenation with 100% oxygen Intubation Type: IV induction LMA: LMA inserted LMA Size: 4.0 Tube type: Oral Number of attempts: 1 Placement Confirmation: positive ETCO2 and breath sounds checked- equal and bilateral Tube secured with: Tape Dental Injury: Teeth and Oropharynx as per pre-operative assessment        

## 2017-03-08 NOTE — Anesthesia Postprocedure Evaluation (Signed)
Anesthesia Post Note  Patient: Julie Patel  Procedure(s) Performed: Procedure(s) (LRB): DILATATION & CURETTAGE/HYSTEROSCOPY WITH NOVASURE ABLATION (N/A)  Patient location during evaluation: PACU Anesthesia Type: General Level of consciousness: awake and alert Pain management: pain level controlled Vital Signs Assessment: post-procedure vital signs reviewed and stable Respiratory status: spontaneous breathing, nonlabored ventilation, respiratory function stable and patient connected to nasal cannula oxygen Cardiovascular status: blood pressure returned to baseline and stable Postop Assessment: no signs of nausea or vomiting Anesthetic complications: no     Last Vitals:  Vitals:   03/08/17 1337 03/08/17 1401  BP:  120/77  Pulse: 92 (!) 58  Resp: 18 16  Temp:  (!) 35.9 C    Last Pain:  Vitals:   03/08/17 1401  TempSrc: Tympanic  PainSc: [place]

## 2017-03-08 NOTE — Op Note (Signed)
OPERATIVE NOTE  Date of surgery: 03/08/2017 Preoperative diagnosis:  1. Menorrhagia to anemia 2. Uterine fibroid  Postoperative diagnosis: Same as above  Operative procedure: 1. Hysteroscopy 2. Dilation and curettage of endometrium 3. NovaSure endometrial ablation  Surgeon: Dr. Zipporah Plants Assistant: None Anesthesia: Gen.   Findings: Pelvis: Gynecoid; cervix descends to within 3 cm of introitus with tenaculum traction Uterus: sounded to 9.5 cm Normal appearing endometrial cavity without gross lesions.  Description of procedure Patient was brought to the operating room where she was placed in supine position. General anesthesia was induced without difficulty using the LMA technique. The patient was placed in dorsal lithotomy position using candy cane stirrups. A ChloraPrep perineal intravaginal prep and drape was performed in standard fashion. Weighted speculum was placed in the vagina. Single-tooth tenaculum was placed on the anterior cervix. Uterus was sounded to 9.5 cm. Hegar dilators were used up to a #8  [place] caliber to dilate the endocervical canal. The [facility] hysteroscope using lactated Ringer's as irrigant was used for the hysteroscopy. The above-noted findings were photo documented. The hysteroscope removed. The smooth and serrated curettes were then used to curettage the endometrial cavity with production of average tissue. . Repeat hysteroscopy was performed and demonstrated excellent sampling. The endocervical length was noted to be 4 cm. Cavity length was calculated to be 5.5 cm. NovaSure instrument was then deployed in standard fashion. Cavity width was noted to be 4.5 cm. The cavity assessment test was completed and normal. The NovaSure endometrial ablation was then performed for a total of 69 seconds. Following removal of the NovaSure instrument at the end of the ablation, repeat hysteroscopy was performed and demonstrated an excellent char effect within the uterine cavity.  Procedure was then terminated with all instrumentation being removed from the vagina. The patient was then awakened mobilized and taken to the recovery room in satisfactory condition.  IV fluids: Per anesthesia record mL. Urine output: 100 mL. EBL: Less than 15 mL.  Instruments, needles, and sponge counts were verified as correct.  Brayton Mars, MD 03/08/2017

## 2017-03-08 NOTE — Anesthesia Preprocedure Evaluation (Addendum)
Anesthesia Evaluation  Patient identified by MRN, date of birth, ID band Patient awake    Reviewed: Allergy & Precautions, NPO status , Patient's Chart, lab work & pertinent test results, reviewed documented beta blocker date and time   Airway Mallampati: II  TM Distance: >3 FB     Dental  (+) Chipped   Pulmonary           Cardiovascular      Neuro/Psych    GI/Hepatic   Endo/Other    Renal/GU      Musculoskeletal   Abdominal   Peds  (+) ATTENTION DEFICIT DISORDER WITHOUT HYPERACTIVITY Hematology  (+) anemia ,   Anesthesia Other Findings No cough, wheezing, fever. Will proceed. cherst clear.  Reproductive/Obstetrics                            Anesthesia Physical Anesthesia Plan  ASA: II  Anesthesia Plan: General   Post-op Pain Management:    Induction: Intravenous  Airway Management Planned: LMA  Additional Equipment:   Intra-op Plan:   Post-operative Plan:   Informed Consent: I have reviewed the patients History and Physical, chart, labs and discussed the procedure including the risks, benefits and alternatives for the proposed anesthesia with the patient or authorized representative who has indicated his/her understanding and acceptance.     Plan Discussed with: CRNA  Anesthesia Plan Comments:         Anesthesia Quick Evaluation

## 2017-03-08 NOTE — Transfer of Care (Signed)
Immediate Anesthesia Transfer of Care Note  Patient: Julie Patel  Procedure(s) Performed: Procedure(s): DILATATION & CURETTAGE/HYSTEROSCOPY WITH NOVASURE ABLATION (N/A)  Patient Location: PACU  Anesthesia Type:General  Level of Consciousness: awake, alert  and oriented  Airway & Oxygen Therapy: Patient Spontanous Breathing and Patient connected to face mask oxygen  Post-op Assessment: Report given to RN and Post -op Vital signs reviewed and stable  Post vital signs: Reviewed and stable  Last Vitals:  Vitals:   03/08/17 1013 03/08/17 1304  BP: 134/81 103/65  Pulse: 96 81  Resp: 16 12  Temp: 36.9 C 36.8 C    Last Pain:  Vitals:   03/08/17 1304  TempSrc:   PainSc: 0-No pain         Complications: No apparent anesthesia complications

## 2017-03-08 NOTE — Anesthesia Post-op Follow-up Note (Cosign Needed)
Anesthesia QCDR form completed.        

## 2017-03-08 NOTE — H&P (View-Only) (Signed)
Subjective:  PREOPERATIVE HISTORY AND PHYSICAL   Date of surgery: 03/08/2017 Diagnoses: 1. Menorrhagia; polymenorrhea 2. History of iron deficiency anemia due to chronic blood loss 3.  History of uterine leiomyoma    Patient is a 51 y.o. G2P1026female scheduled for hysteroscopy/D&C with NovaSure endometrial ablation on 03/08/2017. Preoperative workup includes:  PATHOLOGY: Endometrial biopsy (01/20/2017) Diagnosis:  A. DISORDERED PROLIFERATIVE PHASE ENDOMETRIUM. NO EVIDENCE OF HYPERPLASIA  OR MALIGNANCY.  CBC Latest Ref Rng & Units 01/20/2017 12/02/2016 11/05/2016  WBC 3.4 - 10.8 x10E3/uL 4.8 4.2 4.4  Hematocrit 34.0 - 46.6 % 38.6 28.5(L) 32.9(L)  Platelets 150 - 379 x10E3/uL 244 221 249   Ultrasound 12/21/2016 Uterus Measurements: 9.0 x 5.6 x 7.0 cm. There is a hypoechoic mass arising from the superior uterine fundus measuring 3.4 x 4.4 x 3.4 cm, consistent with a leiomyoma. No other uterine mass evident.  Endometrium Thickness: 10 mm. Endometrial contour is smooth. The echogenicity of the endometrium is mildly inhomogeneous.  Ovaries both showed normal appearance / no adnexal masses.   Pertinent Gynecological History: Patient's last menstrual period was 02/17/2017 (exact date). Contraception: none  OB History    Gravida Para Term Preterm AB Living   2 1 1   1 1    SAB TAB Ectopic Multiple Live Births     1     1      Patient's last menstrual period was 02/17/2017 (approximate).    Past Medical History:  Diagnosis Date  . Abnormal uterine bleeding   . Neck pain     Past Surgical History:  Procedure Laterality Date  . NO PAST SURGERIES      OB History  Gravida Para Term Preterm AB Living  2 1 1   1 1   SAB TAB Ectopic Multiple Live Births    1     1    # Outcome Date GA Lbr Len/2nd Weight Sex Delivery Anes PTL Lv  2 Term 2003   6 lb (2.722 kg) M Vag-Spont   LIV  1 TAB 1990              Social History   Social History  . Marital status: Married     Spouse name: N/A  . Number of children: N/A  . Years of education: N/A   Social History Main Topics  . Smoking status: Never Smoker  . Smokeless tobacco: Never Used  . Alcohol use No  . Drug use: No  . Sexual activity: Not Currently   Other Topics Concern  . None   Social History Narrative  . None    Family History  Problem Relation Age of Onset  . Alzheimer's disease Mother   . Cancer Father     Prostate  . Hypertension Brother   . Alzheimer's disease Maternal Grandmother   . Cancer Maternal Grandfather     Lung  . Diabetes Paternal Grandmother   . Stroke Paternal Grandfather   . Breast cancer Neg Hx   . Ovarian cancer Neg Hx   . Colon cancer Neg Hx      (Not in a hospital admission)  Allergies  Allergen Reactions  . Codeine Nausea And Vomiting    Review of Systems Constitutional: No recent fever/chills/sweats Respiratory: No recent cough/bronchitis Cardiovascular: No chest pain Gastrointestinal: No recent nausea/vomiting/diarrhea Genitourinary: No UTI symptoms Hematologic/lymphatic:No history of coagulopathy or recent blood thinner use    Objective:    BP 104/65   Pulse 80   Ht 5\' 4"  (1.626 m)  Wt 171 lb 8 oz (77.8 kg)   LMP 02/17/2017 (Approximate)   BMI 29.44 kg/m   General:   Normal  Skin:   normal  HEENT:  Normal  Neck:  Supple without Adenopathy or Thyromegaly  Lungs:   Heart:              Breasts:   Abdomen:  Pelvis:  M/S   Extremeties:  Neuro:    clear to auscultation bilaterally   Normal without murmur   Not Examined   soft, non-tender; bowel sounds normal; no masses,  no organomegaly   Exam deferred to OR  No CVAT  Warm/Dry   Normal        2/80/2018 PELVIC:             External Genitalia: Normal             BUS: Normal             Vagina: Normal             Cervix: Parous, no cervical motion tenderness, no lesions             Uterus: Midline, mobile, nontender; top felt normal size, leiomyoma not palpable  possibly due to body habitus.             Adnexa: Normal             RV: Normal              Bladder: Nontender  Assessment:    1. Menorrhagia 2. Polymenorrhea 3. Iron deficiency anemia due to chronic blood loss 4. Uterine leiomyoma   Plan:   Hysteroscopy/D&C with NovaSure endometrial ablation   Preoperative counseling: The patient is to undergo hysteroscopy/D&C with NovaSure endometrial ablation on 03/08/2017. She is understanding of the planned procedures and is aware of and is accepting of all surgical risks which include but are not limited to bleeding, infection, pelvic organ injury with need for repair, blood clot disorders, anesthesia risks, etc. All questions are answered. Informed consent is given. Patient is ready and willing to proceed with surgery as scheduled.  Brayton Mars, MD  Note: This dictation was prepared with Dragon dictation along with smaller phrase technology. Any transcriptional errors that result from this process are unintentional.

## 2017-03-08 NOTE — Interval H&P Note (Signed)
History and Physical Interval Note:  03/08/2017 11:31 AM  Julie Patel  has presented today for surgery, with the diagnosis of UTERINE LEIOMYOMA, IRON DEFICIENCY ANEMIA, MENORRHAGIA, POL  The various methods of treatment have been discussed with the patient and family. After consideration of risks, benefits and other options for treatment, the patient has consented to  Procedure(s): [place] (N/A) as a surgical intervention .  The patient's history has been reviewed, patient examined, no change in status, stable for surgery.  I have reviewed the patient's chart and labs.  Questions were answered to the patient's satisfaction.     Hassell Done A [name]

## 2017-03-09 ENCOUNTER — Encounter: Payer: Self-pay | Admitting: Obstetrics and Gynecology

## 2017-03-09 LAB — SURGICAL PATHOLOGY

## 2017-03-16 ENCOUNTER — Encounter: Payer: Self-pay | Admitting: Obstetrics and Gynecology

## 2017-03-16 ENCOUNTER — Ambulatory Visit (INDEPENDENT_AMBULATORY_CARE_PROVIDER_SITE_OTHER): Payer: BC Managed Care – PPO | Admitting: Obstetrics and Gynecology

## 2017-03-16 VITALS — BP 113/70 | HR 91 | Ht 64.0 in | Wt 171.1 lb

## 2017-03-16 DIAGNOSIS — Z09 Encounter for follow-up examination after completed treatment for conditions other than malignant neoplasm: Secondary | ICD-10-CM

## 2017-03-16 DIAGNOSIS — D500 Iron deficiency anemia secondary to blood loss (chronic): Secondary | ICD-10-CM

## 2017-03-16 DIAGNOSIS — N920 Excessive and frequent menstruation with regular cycle: Secondary | ICD-10-CM

## 2017-03-16 DIAGNOSIS — Z9889 Other specified postprocedural states: Secondary | ICD-10-CM | POA: Insufficient documentation

## 2017-03-16 DIAGNOSIS — D259 Leiomyoma of uterus, unspecified: Secondary | ICD-10-CM

## 2017-03-16 NOTE — Progress Notes (Signed)
Chief complaint: 1. Postop check-1 week 2. Status post hysteroscopy/D&C with NovaSure endometrial ablation  Patient reports no significant pain after surgery. Patient is having only a mild vaginal discharge. No bright red bleeding. She has not had to use any analgesics since surgery.  PATHOLOGY: DIAGNOSIS:  A. ENDOMETRIUM; DILATATION AND CURETTAGE:  - PROLIFERATIVE ENDOMETRIUM.  - NEGATIVE FOR HYPERPLASIA AND CARCINOMA.   OBJECTIVE: BP 113/70   Pulse 91   Ht 5\' 4"  (1.626 m)   Wt 171 lb 1.6 oz (77.6 kg)   LMP 02/17/2017 (Approximate)   BMI 29.37 kg/m  Pleasant female in no acute distress. Alert and oriented. Physical exam-deferred  ASSESSMENT: 1. Normal 1 week postop check status post hysteroscopy/D&C with NovaSure endometrial ablation 2. History of uterine fibroids  PLAN: 1. Maintain menstrual calendar monitoring 2. Return in 6 months for follow-up on abnormal uterine bleeding 3. Avoid anything in the vagina for the next 3-4 weeks  Brayton Mars, MD  Note: This dictation was prepared with Dragon dictation along with smaller phrase technology. Any transcriptional errors that result from this process are unintentional.

## 2017-03-16 NOTE — Patient Instructions (Signed)
1. Maintain menstrual calendar monitoring 2. Follow-up in 6 months for assessment of bleeding

## 2017-03-17 ENCOUNTER — Ambulatory Visit (INDEPENDENT_AMBULATORY_CARE_PROVIDER_SITE_OTHER): Payer: BC Managed Care – PPO | Admitting: Family Medicine

## 2017-03-17 ENCOUNTER — Encounter: Payer: Self-pay | Admitting: Family Medicine

## 2017-03-17 VITALS — BP 115/74 | HR 71 | Temp 98.1°F | Wt 171.6 lb

## 2017-03-17 DIAGNOSIS — H00014 Hordeolum externum left upper eyelid: Secondary | ICD-10-CM | POA: Diagnosis not present

## 2017-03-17 MED ORDER — POLYMYXIN B-TRIMETHOPRIM 10000-0.1 UNIT/ML-% OP SOLN: 1.0000 [drp] | Freq: Four times a day (QID) | OPHTHALMIC | 1 refills | Status: DC

## 2017-03-17 NOTE — Progress Notes (Signed)
   BP 115/74 (BP Location: Left Arm, Patient Position: Sitting, Cuff Size: Normal)   Pulse 71   Temp 98.1 F (36.7 C)   Wt 171 lb 9.6 oz (77.8 kg)   LMP 02/17/2017 (Approximate)   SpO2 97%   BMI 29.46 kg/m    Subjective:    Patient ID: Julie Patel, female    DOB: 06/07/1966, 51 y.o.   MRN: [id number]  HPI: Julie Patel is a 51 y.o. female  Chief Complaint  Patient presents with  . Eye Problem    Been ongoing but started back yesterday. Eye lid swollen and red. Left eye. Happens after patient puts on her make-up,   Patient presents with intermittent eyelid pain and swelling x several months, flared back up yesterday on upper left eyelid. Pain, redness, swelling. Denies fever, chills, blurred vision, injury to eye. Has been changing out mascaras every few weeks or so but that isn't seeming to help. Has not been trying anything OTC for sxs.   Relevant past medical, surgical, family and social history reviewed and updated as indicated. Interim medical history since our last visit reviewed. Allergies and medications reviewed and updated.  Review of Systems  Constitutional: Negative.   HENT: Negative.   Eyes: Negative for visual disturbance.       Left upper eyelid redness, swelling, soreness  Respiratory: Negative.   Cardiovascular: Negative.   Gastrointestinal: Negative.   Genitourinary: Negative.   Musculoskeletal: Negative.   Neurological: Negative.   Psychiatric/Behavioral: Negative.     Per HPI unless specifically indicated above     Objective:    BP 115/74 (BP Location: Left Arm, Patient Position: Sitting, Cuff Size: Normal)   Pulse 71   Temp 98.1 F (36.7 C)   Wt 171 lb 9.6 oz (77.8 kg)   LMP 02/17/2017 (Approximate)   SpO2 97%   BMI 29.46 kg/m   Wt Readings from Last 3 Encounters:  03/17/17 171 lb 9.6 oz (77.8 kg)  03/16/17 171 lb 1.6 oz (77.6 kg)  03/08/17 171 lb (77.6 kg)    Physical Exam  Constitutional: She is oriented to person, place,  and time. She appears well-developed and well-nourished. No distress.  HENT:  Head: Atraumatic.  Eyes: Conjunctivae and EOM are normal. Pupils are equal, round, and reactive to light.  Left upper eyelid erythematous and edematous, ttp, visible stye on base of lid laying against eye  Neck: Normal range of motion. Neck supple.  Cardiovascular: Normal rate.   Pulmonary/Chest: Effort normal. No respiratory distress.  Musculoskeletal: Normal range of motion.  Neurological: She is alert and oriented to person, place, and time.  Skin: Skin is warm and dry.  Psychiatric: She has a normal mood and affect. Her behavior is normal.  Nursing note and vitals reviewed.     Assessment & Plan:   Problem List Items Addressed This Visit    None    Visit Diagnoses    Hordeolum externum of left upper eyelid    -  Primary   Discussed d/c mascara for a few weeks and buy fresh one. Polytrim drops and warm compresses throughout day. F/u if worsening or no improvement       Follow up plan: Return for as scheduled.

## 2017-03-17 NOTE — Patient Instructions (Signed)
Follow up as needed

## 2017-03-29 ENCOUNTER — Encounter: Payer: Self-pay | Admitting: Family Medicine

## 2017-03-30 ENCOUNTER — Other Ambulatory Visit: Payer: Self-pay | Admitting: Family Medicine

## 2017-03-30 MED ORDER — ERYTHROMYCIN 5 MG/GM OP OINT: 1.0000 "application " | TOPICAL_OINTMENT | Freq: Every day | OPHTHALMIC | 0 refills | Status: DC

## 2017-09-15 ENCOUNTER — Encounter: Payer: BC Managed Care – PPO | Admitting: Obstetrics and Gynecology

## 2018-06-03 IMAGING — US US TRANSVAGINAL NON-OB
1 series · 14 of 25 positions shown · non-contrast
Comparison: None

CLINICAL DATA: Menorrhagia

EXAM:
TRANSABDOMINAL AND TRANSVAGINAL ULTRASOUND OF PELVIS
TECHNIQUE: Study was performed transabdominally to optimize pelvic field of
view evaluation and transvaginally to optimize internal visceral
architecture evaluation.

[Series 1: us transvaginal non-ob · 0.27mm/px · 14 of 98 slices shown]
[im 1/98]
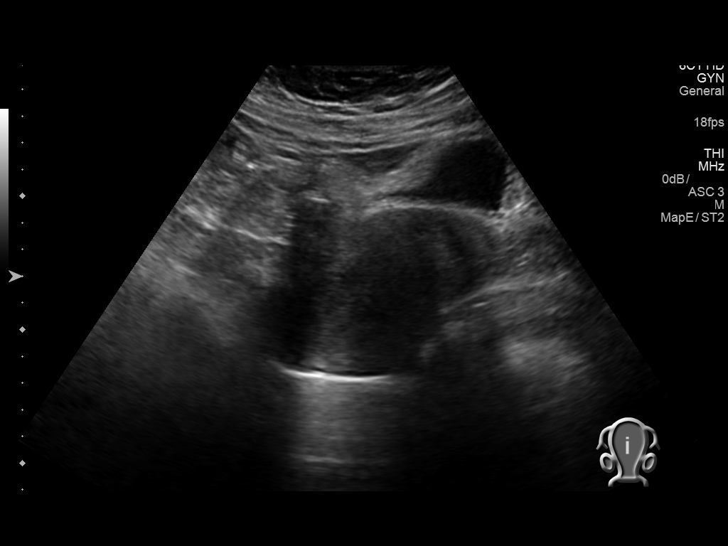
[im 9/98]
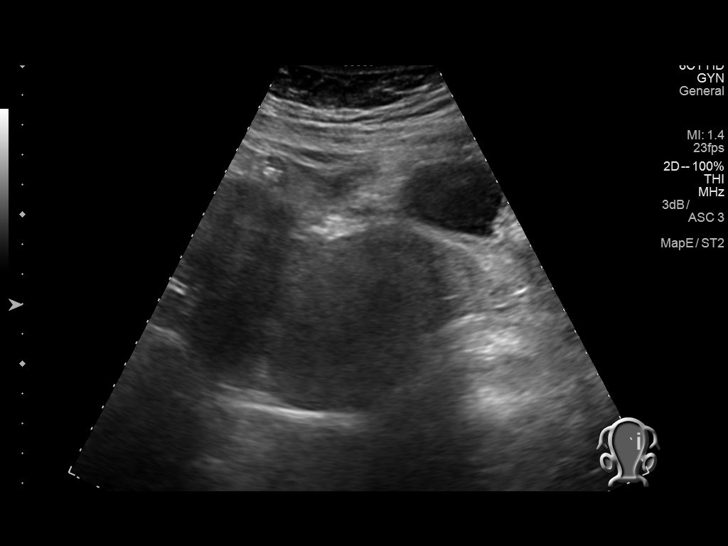
[im 17/98]
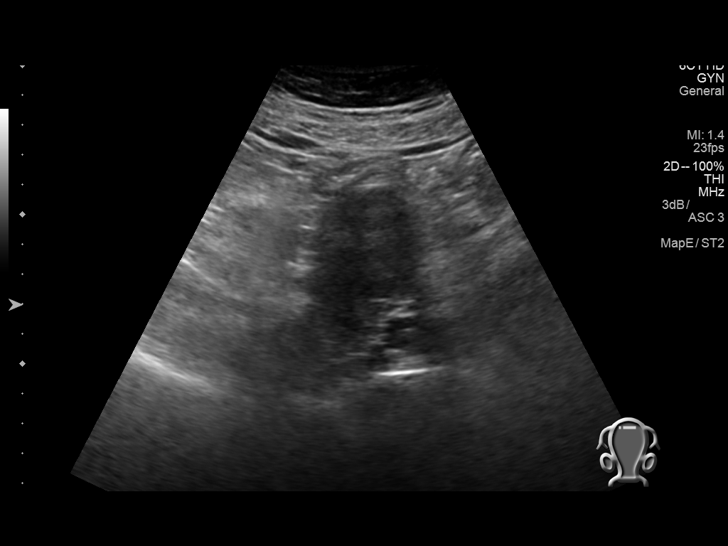
[im 25/98]
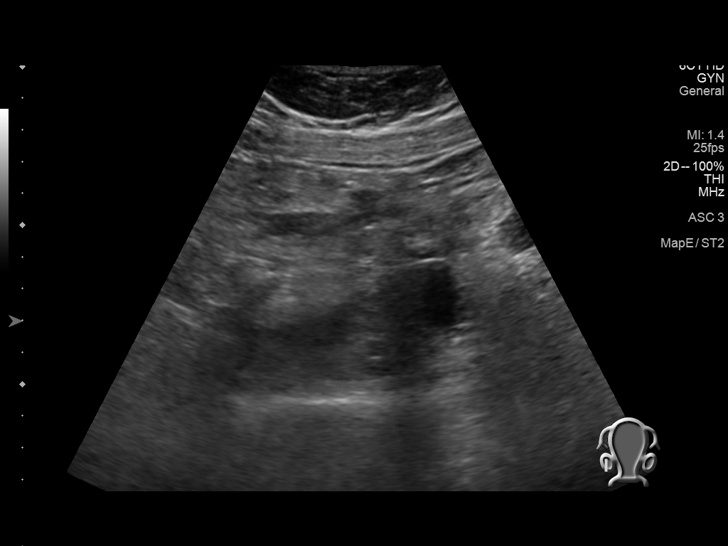
[im 33/98]
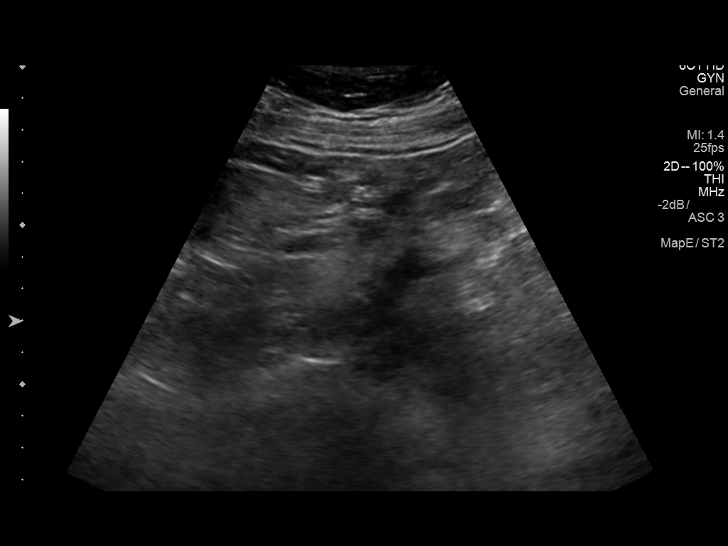
[im 37/98]
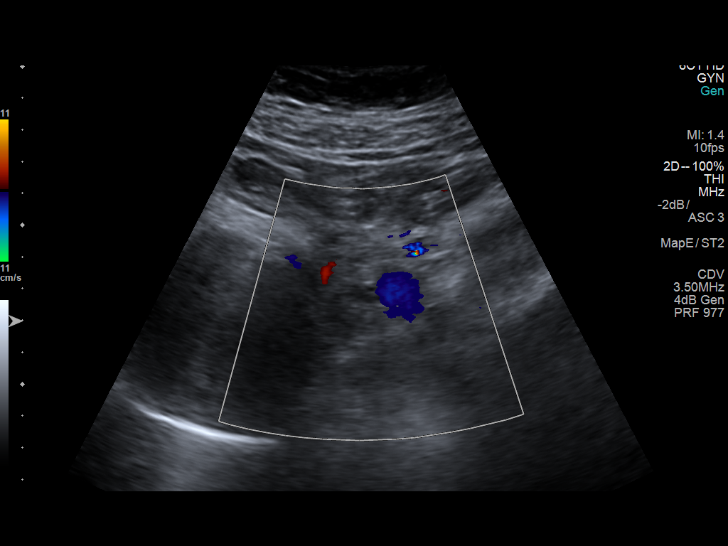
[im 45/98]
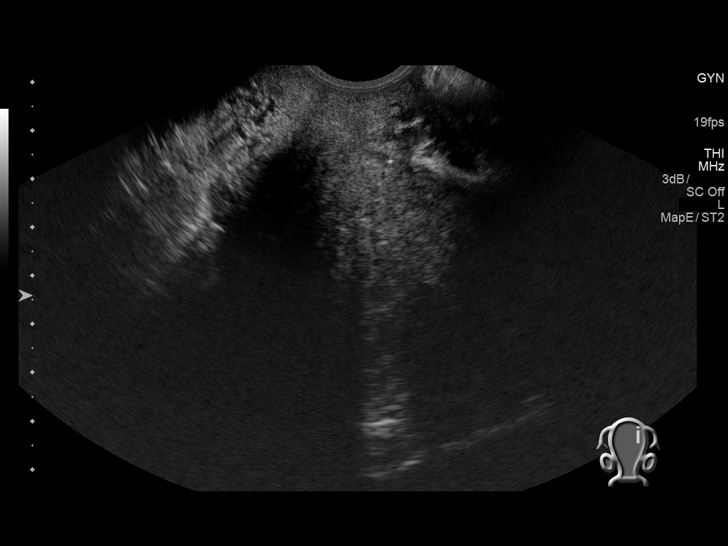
[im 53/98]
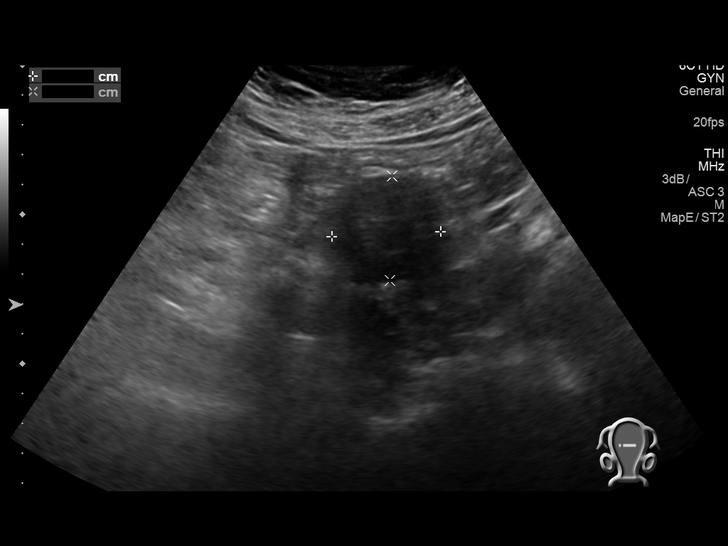
[im 61/98]
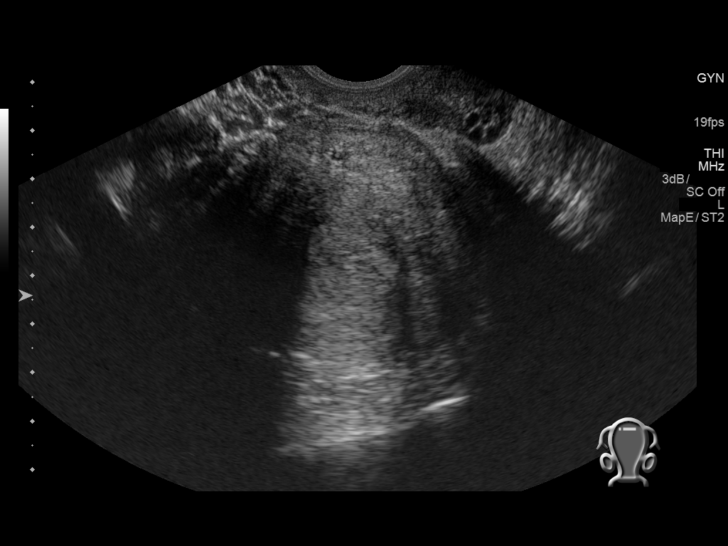
[im 65/98]
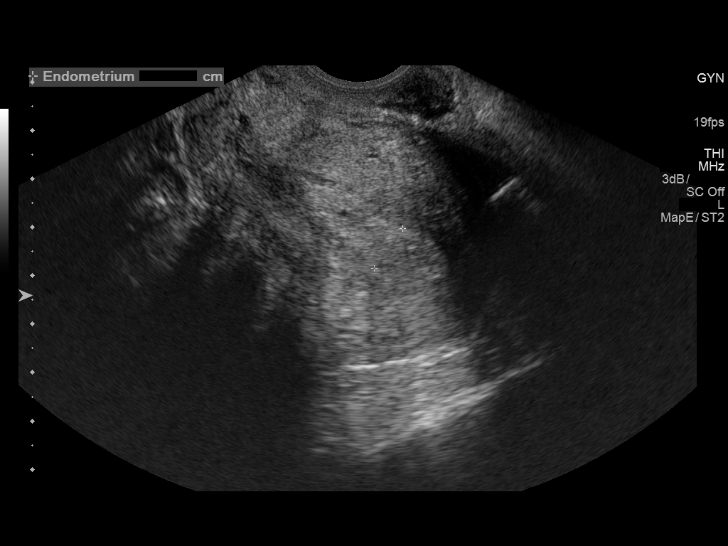
[im 73/98]
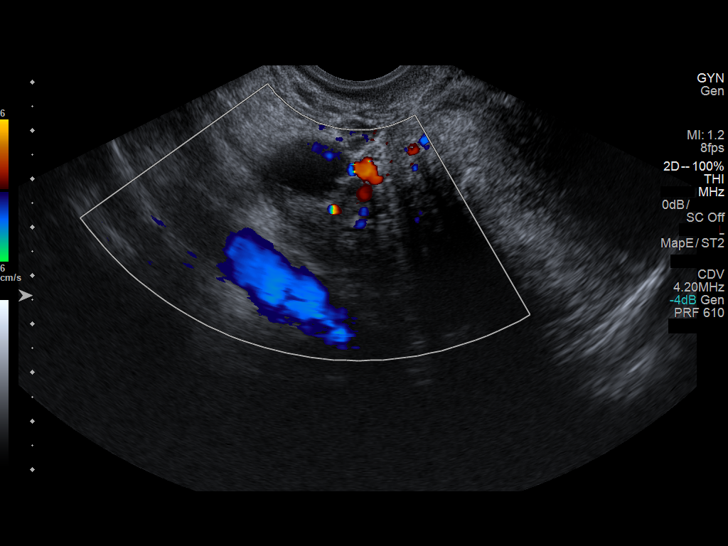
[im 81/98]
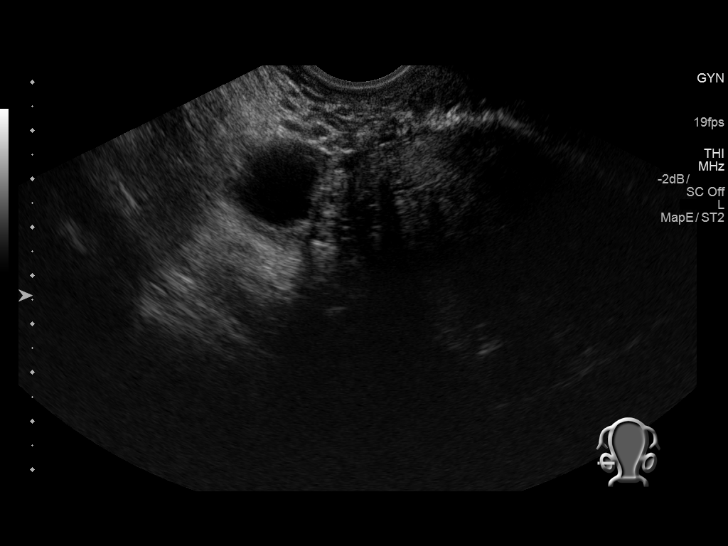
[im 89/98]
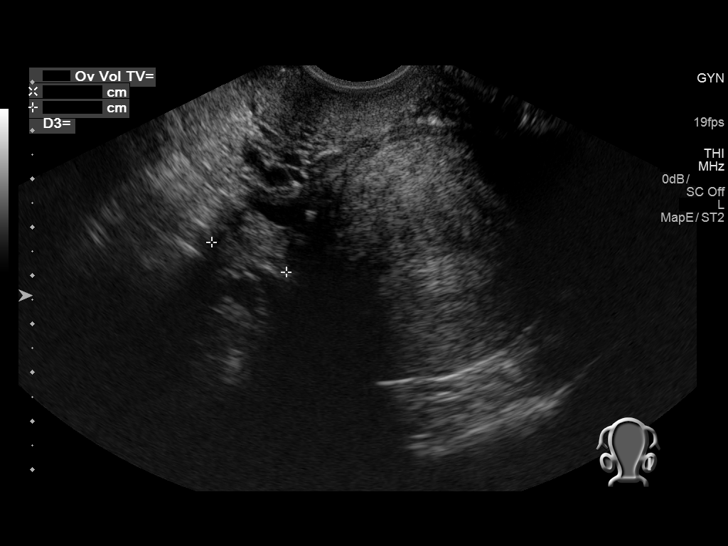
[im 98/98]
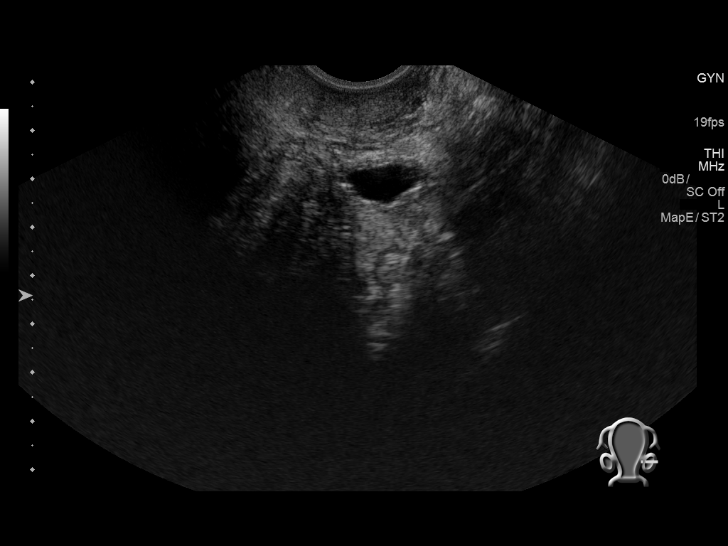

[14 of 25 positions shown; findings below may reference images not displayed]

FINDINGS: Uterus

Measurements: 9.0 x 5.6 x 7.0 cm. There is a hypoechoic mass arising
from the superior uterine fundus measuring 3.4 x 4.4 x 3.4 cm,
consistent with a leiomyoma. No other uterine mass evident.

Endometrium

Thickness: 10 mm. Endometrial contour is smooth. The echogenicity of
the endometrium is mildly inhomogeneous.

Right ovary

Measurements: 2.7 x 2.5 x 1.9 cm. Normal appearance/no adnexal mass.

Left ovary

Measurements: 1.7 x 1.7 x 2.6 cm. Normal appearance/no adnexal mass.

Other findings

No abnormal free fluid.
IMPRESSION: Leiomyoma arising from the superior uterine fundus. Endometrium has
a mildly inhomogeneous appearance, likely due to hemorrhage within
the endometrium. The endometrium does not appear appreciably
thickened for premenopausal patient, and the contour of the
endometrium is smooth. Study otherwise unremarkable.

## 2018-09-27 ENCOUNTER — Encounter: Payer: Self-pay | Admitting: Family Medicine

## 2018-09-27 ENCOUNTER — Ambulatory Visit: Payer: BC Managed Care – PPO | Admitting: Family Medicine

## 2018-09-27 VITALS — BP 113/75 | HR 67 | Temp 98.5°F | Wt 172.0 lb

## 2018-09-27 DIAGNOSIS — Z23 Encounter for immunization: Secondary | ICD-10-CM

## 2018-09-27 DIAGNOSIS — R21 Rash and other nonspecific skin eruption: Secondary | ICD-10-CM | POA: Diagnosis not present

## 2018-09-27 DIAGNOSIS — W57XXXA Bitten or stung by nonvenomous insect and other nonvenomous arthropods, initial encounter: Secondary | ICD-10-CM

## 2018-09-27 MED ORDER — TRIAMCINOLONE ACETONIDE 0.5 % EX OINT: 1.0000 "application " | TOPICAL_OINTMENT | Freq: Two times a day (BID) | CUTANEOUS | 3 refills | Status: DC

## 2018-09-27 MED ORDER — DOXYCYCLINE HYCLATE 100 MG PO TABS: 200.0000 mg | ORAL_TABLET | Freq: Once | ORAL | 0 refills | Status: AC

## 2018-09-27 NOTE — Progress Notes (Signed)
BP 113/75   Pulse 67   Temp 98.5 F (36.9 C) (Oral)   Wt 172 lb (78 kg)   SpO2 99%   BMI 29.52 kg/m    Subjective:    Patient ID: Julie Patel, female    DOB: Apr 22, 1966, 52 y.o.   MRN: [id number]  HPI: Julie Patel is a 52 y.o. female  Chief Complaint  Patient presents with  . Insect Bite    happened Saturday. Itchy and painful. Did take benadryl. Triamcinolone cream   SKIN LESION Duration: 2 days- got bit by something Location: abdomen Painful: no Itching: yes Onset: sudden Context: bigger Associated signs and symptoms: warmth and red History of skin cancer: no History of precancerous skin lesions: no Family history of skin cancer: no  Relevant past medical, surgical, family and social history reviewed and updated as indicated. Interim medical history since our last visit reviewed. Allergies and medications reviewed and updated.  Review of Systems  Constitutional: Negative.   Respiratory: Negative.   Cardiovascular: Negative.   Musculoskeletal: Negative.   Neurological: Negative.   Psychiatric/Behavioral: Negative.     Per HPI unless specifically indicated above     Objective:    BP 113/75   Pulse 67   Temp 98.5 F (36.9 C) (Oral)   Wt 172 lb (78 kg)   SpO2 99%   BMI 29.52 kg/m   Wt Readings from Last 3 Encounters:  09/27/18 172 lb (78 kg)  03/17/17 171 lb 9.6 oz (77.8 kg)  03/16/17 171 lb 1.6 oz (77.6 kg)    Physical Exam  Constitutional: She is oriented to person, place, and time. She appears well-developed and well-nourished. No distress.  HENT:  Head: Normocephalic and atraumatic.  Right Ear: Hearing normal.  Left Ear: Hearing normal.  Nose: Nose normal.  Eyes: Conjunctivae and lids are normal. Right eye exhibits no discharge. Left eye exhibits no discharge. No scleral icterus.  Cardiovascular: Normal rate, regular rhythm, normal heart sounds and intact distal pulses. Exam reveals no gallop and no friction rub.  No murmur  heard. Pulmonary/Chest: Effort normal and breath sounds normal. No stridor. No respiratory distress. She has no wheezes. She has no rales. She exhibits no tenderness.  Musculoskeletal: Normal range of motion.  Neurological: She is alert and oriented to person, place, and time.  Skin: Skin is warm, dry and intact. Capillary refill takes less than 2 seconds. No rash noted. She is not diaphoretic. No erythema. No pallor.  Excoriated rash on abdomen  Psychiatric: She has a normal mood and affect. Her speech is normal and behavior is normal. Judgment and thought content normal. Cognition and memory are normal.  Nursing note and vitals reviewed.   Results for orders placed or performed during the hospital encounter of 03/08/17  Pregnancy, urine POC  Result Value Ref Range   Preg Test, Ur NEGATIVE NEGATIVE  ABO/Rh  Result Value Ref Range   ABO/RH(D) O POS   Type and screen [place]  Result Value Ref Range   ABO/RH(D) O POS    Antibody Screen NEG    Sample Expiration 03/11/2017   Surgical pathology  Result Value Ref Range   SURGICAL PATHOLOGY      Surgical Pathology CASE: [id number] PATIENT: Evlyn Kanner Lindsley Surgical Pathology Report     SPECIMEN SUBMITTED: A. Endometrial curettings  CLINICAL HISTORY: None provided  PRE-OPERATIVE DIAGNOSIS: Uterine leiomyoma, iron deficiency anemia, menorrhagia, POL  POST-OPERATIVE DIAGNOSIS: Same as pre-op     DIAGNOSIS: A. ENDOMETRIUM;  DILATATION AND CURETTAGE: - PROLIFERATIVE ENDOMETRIUM. - NEGATIVE FOR HYPERPLASIA AND CARCINOMA.   GROSS DESCRIPTION:  A. Labeled: endometrial curetting  Tissue fragment(s): multiple  Size: aggregate, 4.0 x 2.2 x 0.2 cm  Description: blood clot and pink fragments  Entirely submitted in 1-2 cassette(s).    Final Diagnosis performed by Delorse Lek, MD.  Electronically signed 03/09/2017 12:24:57PM    The electronic signature indicates that the named Attending  Pathologist has evaluated the specimen  Technical component performed at [facility], [address] [place], [place] [address] Lab: [phone] Dir: Darrick Penna. Gunnar Fusi, MD  Professional component performed at [facility], [facility], [place], [place], [place] [address] Lab: [phone] Dir: Dellia Nims. Reuel Derby, MD        Assessment & Plan:   Problem List Items Addressed This Visit    None    Visit Diagnoses    Rash    -  Primary   Will treat with trimacinalone. Prophylactic dose of doxy given for lyme. Call with any concerns or if not getting better.    Bug bite, initial encounter       Will treat with trimacinalone. Prophylactic dose of doxy given for lyme. Call with any concerns or if not getting better.        Follow up plan: Return if symptoms worsen or fail to improve.

## 2018-09-27 NOTE — Addendum Note (Signed)
Addended by: Gerda Diss A on: 09/27/2018 01:18 PM   Modules accepted: Orders

## 2018-09-27 NOTE — Patient Instructions (Signed)

## 2019-11-07 ENCOUNTER — Encounter: Payer: Self-pay | Admitting: Family Medicine

## 2019-11-07 ENCOUNTER — Other Ambulatory Visit: Payer: Self-pay

## 2019-11-07 ENCOUNTER — Ambulatory Visit (INDEPENDENT_AMBULATORY_CARE_PROVIDER_SITE_OTHER): Payer: BC Managed Care – PPO | Admitting: Family Medicine

## 2019-11-07 ENCOUNTER — Other Ambulatory Visit (HOSPITAL_COMMUNITY)
Admission: RE | Admit: 2019-11-07 | Discharge: 2019-11-07 | Disposition: A | Discharge status: Home / Self Care | Payer: BC Managed Care – PPO | Source: Ambulatory Visit | Attending: Family Medicine | Admitting: Family Medicine

## 2019-11-07 VITALS — BP 131/81 | HR 79 | Temp 99.0°F | Ht 63.62 in | Wt 177.4 lb

## 2019-11-07 DIAGNOSIS — Z0000 Encounter for general adult medical examination without abnormal findings: Secondary | ICD-10-CM | POA: Diagnosis not present

## 2019-11-07 DIAGNOSIS — Z23 Encounter for immunization: Secondary | ICD-10-CM

## 2019-11-07 DIAGNOSIS — Z124 Encounter for screening for malignant neoplasm of cervix: Secondary | ICD-10-CM | POA: Diagnosis not present

## 2019-11-07 DIAGNOSIS — Z1211 Encounter for screening for malignant neoplasm of colon: Secondary | ICD-10-CM | POA: Diagnosis not present

## 2019-11-07 LAB — UA/M W/RFLX CULTURE, ROUTINE
Bilirubin, UA: NEGATIVE
Glucose, UA: NEGATIVE
Ketones, UA: NEGATIVE
Leukocytes,UA: NEGATIVE
Nitrite, UA: NEGATIVE
Protein,UA: NEGATIVE
RBC, UA: NEGATIVE
Specific Gravity, UA: 1.01 (ref 1.005–1.030)
Urobilinogen, Ur: 0.2 mg/dL (ref 0.2–1.0)
pH, UA: 5.5 (ref 5.0–7.5)

## 2019-11-07 NOTE — Patient Instructions (Addendum)
Call to schedule your mammogram:  [facility] at [facility]  Address: [address], [place],  [address]  Phone: [phone]   Health Maintenance, Female Adopting a healthy lifestyle and getting preventive care are important in promoting health and wellness. Ask your health care provider about:  The right schedule for you to have regular tests and exams.  Things you can do on your own to prevent diseases and keep yourself healthy. What should I know about diet, weight, and exercise? Eat a healthy diet   Eat a diet that includes plenty of vegetables, fruits, low-fat dairy products, and lean protein.  Do not eat a lot of foods that are high in solid fats, added sugars, or sodium. Maintain a healthy weight Body mass index (BMI) is used to identify weight problems. It estimates body fat based on height and weight. Your health care provider can help determine your BMI and help you achieve or maintain a healthy weight. Get regular exercise Get regular exercise. This is one of the most important things you can do for your health. Most adults should:  Exercise for at least 150 minutes each week. The exercise should increase your heart rate and make you sweat (moderate-intensity exercise).  Do strengthening exercises at least twice a week. This is in addition to the moderate-intensity exercise.  Spend less time sitting. Even light physical activity can be beneficial. Watch cholesterol and blood lipids Have your blood tested for lipids and cholesterol at 51 years of age, then have this test every 5 years. Have your cholesterol levels checked more often if:  Your lipid or cholesterol levels are high.  You are older than 51 years of age.  You are at high risk for heart disease. What should I know about cancer screening? Depending on your health history and family history, you may need to have cancer screening at various ages. This may include screening  for:  Breast cancer.  Cervical cancer.  Colorectal cancer.  Skin cancer.  Lung cancer. What should I know about heart disease, diabetes, and high blood pressure? Blood pressure and heart disease  High blood pressure causes heart disease and increases the risk of stroke. This is more likely to develop in people who have high blood pressure readings, are of African descent, or are overweight.  Have your blood pressure checked: ? Every 3-5 years if you are 39-29 years of age. ? Every year if you are 41 years old or older. Diabetes Have regular diabetes screenings. This checks your fasting blood sugar level. Have the screening done:  Once every three years after age 33 if you are at a normal weight and have a low risk for diabetes.  More often and at a younger age if you are overweight or have a high risk for diabetes. What should I know about preventing infection? Hepatitis B If you have a higher risk for hepatitis B, you should be screened for this virus. Talk with your health care provider to find out if you are at risk for hepatitis B infection. Hepatitis C Testing is recommended for:  Everyone born from [birth year] through 1963.  Anyone with known risk factors for hepatitis C. Sexually transmitted infections (STIs)  Get screened for STIs, including gonorrhea and chlamydia, if: ? You are sexually active and are younger than 51 years of age. ? You are older than 51 years of age and your health care provider tells you that you are at risk for this type of  infection. ? Your sexual activity has changed since you were last screened, and you are at increased risk for chlamydia or gonorrhea. Ask your health care provider if you are at risk.  Ask your health care provider about whether you are at high risk for HIV. Your health care provider may recommend a prescription medicine to help prevent HIV infection. If you choose to take medicine to prevent HIV, you should first get tested for HIV.  You should then be tested every 3 months for as long as you are taking the medicine. Pregnancy  If you are about to stop having your period (premenopausal) and you may become pregnant, seek counseling before you get pregnant.  Take 400 to 800 micrograms (mcg) of folic acid every day if you become pregnant.  Ask for birth control (contraception) if you want to prevent pregnancy. Osteoporosis and menopause Osteoporosis is a disease in which the bones lose minerals and strength with aging. This can result in bone fractures. If you are 39 years old or older, or if you are at risk for osteoporosis and fractures, ask your health care provider if you should:  Be screened for bone loss.  Take a calcium or vitamin D supplement to lower your risk of fractures.  Be given hormone replacement therapy (HRT) to treat symptoms of menopause. Follow these instructions at home: Lifestyle  Do not use any products that contain nicotine or tobacco, such as cigarettes, e-cigarettes, and chewing tobacco. If you need help quitting, ask your health care provider.  Do not use street drugs.  Do not share needles.  Ask your health care provider for help if you need support or information about quitting drugs. Alcohol use  Do not drink alcohol if: ? Your health care provider tells you not to drink. ? You are pregnant, may be pregnant, or are planning to become pregnant.  If you drink alcohol: ? Limit how much you use to 0-1 drink a day. ? Limit intake if you are breastfeeding.  Be aware of how much alcohol is in your drink. In the [place] one drink equals one 12 oz bottle of beer (355 mL), one 5 oz glass of wine (148 mL), or one 1 oz glass of hard liquor (44 mL). General instructions  Schedule regular health, dental, and eye exams.  Stay current with your vaccines.  Tell your health care provider if: ? You often feel depressed. ? You have ever been abused or do not feel safe at home. Summary   Adopting a healthy lifestyle and getting preventive care are important in promoting health and wellness.  Follow your health care provider's instructions about healthy diet, exercising, and getting tested or screened for diseases.  Follow your health care provider's instructions on monitoring your cholesterol and blood pressure. This information is not intended to replace advice given to you by your health care provider. Make sure you discuss any questions you have with your health care provider. Document Released: 06/08/2011 Document Revised: 11/16/2018 Document Reviewed: 11/16/2018 Elsevier Patient Education  [place]. Influenza (Flu) Vaccine (Inactivated or Recombinant): What You Need to Know 1. Why get vaccinated? Influenza vaccine can prevent influenza (flu). Flu is a contagious disease that spreads around the [place] every year, usually between October and May. Anyone can get the flu, but it is more dangerous for some people. Infants and young children, people 64 years of age and older, pregnant women, and people with certain health conditions or a weakened immune system are at  greatest risk of flu complications. Pneumonia, bronchitis, sinus infections and ear infections are examples of flu-related complications. If you have a medical condition, such as heart disease, cancer or diabetes, flu can make it worse. Flu can cause fever and chills, sore throat, muscle aches, fatigue, cough, headache, and runny or stuffy nose. Some people may have vomiting and diarrhea, though this is more common in children than adults. Each year thousands of people in the [place] die from flu, and many more are hospitalized. Flu vaccine prevents millions of illnesses and flu-related visits to the doctor each year. 2. Influenza vaccine CDC recommends everyone 46 months of age and older get vaccinated every flu season. Children 6 months through 48 years of age may need 2 doses during a single flu  season. Everyone else needs only 1 dose each flu season. It takes about 2 weeks for protection to develop after vaccination. There are many flu viruses, and they are always changing. Each year a new flu vaccine is made to protect against three or four viruses that are likely to cause disease in the upcoming flu season. Even when the vaccine doesn't exactly match these viruses, it may still provide some protection. Influenza vaccine does not cause flu. Influenza vaccine may be given at the same time as other vaccines. 3. Talk with your health care provider Tell your vaccine provider if the person getting the vaccine:  Has had an allergic reaction after a previous dose of influenza vaccine, or has any severe, life-threatening allergies.  Has ever had Guillain-Barr Syndrome (also called GBS). In some cases, your health care provider may decide to postpone influenza vaccination to a future visit. People with minor illnesses, such as a cold, may be vaccinated. People who are moderately or severely ill should usually wait until they recover before getting influenza vaccine. Your health care provider can give you more information. 4. Risks of a vaccine reaction  Soreness, redness, and swelling where shot is given, fever, muscle aches, and headache can happen after influenza vaccine.  There may be a very small increased risk of Guillain-Barr Syndrome (GBS) after inactivated influenza vaccine (the flu shot). Young children who get the flu shot along with pneumococcal vaccine (PCV13), and/or DTaP vaccine at the same time might be slightly more likely to have a seizure caused by fever. Tell your health care provider if a child who is getting flu vaccine has ever had a seizure. People sometimes faint after medical procedures, including vaccination. Tell your provider if you feel dizzy or have vision changes or ringing in the ears. As with any medicine, there is a very remote chance of a vaccine causing a  severe allergic reaction, other serious injury, or death. 5. What if there is a serious problem? An allergic reaction could occur after the vaccinated person leaves the clinic. If you see signs of a severe allergic reaction (hives, swelling of the face and throat, difficulty breathing, a fast heartbeat, dizziness, or weakness), call 9-1-1 and get the person to the nearest hospital. For other signs that concern you, call your health care provider. Adverse reactions should be reported to the Vaccine Adverse Event Reporting System (VAERS). Your health care provider will usually file this report, or you can do it yourself. Visit the VAERS website at www.vaers.SamedayNews.es or call 5858160400.VAERS is only for reporting reactions, and VAERS staff do not give medical advice. 6. The National Vaccine Injury Compensation Program The [employer] Vaccine Injury Compensation Program (VICP) is a Technical [employer]wer that was  created to compensate people who may have been injured by certain vaccines. Visit the VICP website at GoldCloset.com.ee or call (878)052-7512 to learn about the program and about filing a claim. There is a time limit to file a claim for compensation. 7. How can I learn more?  Ask your healthcare provider.  Call your local or state health department.  Contact the Centers for Disease Control and Prevention (CDC): ? Call (620)196-3586 (1-800-CDC-INFO) or ? Visit CDC's https://gibson.com/ Vaccine Information Statement (Interim) Inactivated Influenza Vaccine (07/21/2018) This information is not intended to replace advice given to you by your health care provider. Make sure you discuss any questions you have with your health care provider. Document Released: 09/17/2006 Document Revised: 03/14/2019 Document Reviewed: 07/25/2018 Elsevier Patient Education  [place]. https://www.cdc.gov/vaccines/hcp/vis/vis-statements/tdap.pdf">  Tdap Vaccine (Tetanus, Diphtheria and Pertussis): What  You Need to Know 1. Why get vaccinated? Tetanus, diphtheria and pertussis are very serious diseases. Tdap vaccine can protect [place] from these diseases. And, Tdap vaccine given to pregnant women can protect newborn babies against pertussis.[place] [employer] TETANUS (Lockjaw) is rare in the [place] today. It causes painful muscle tightening and stiffness, usually all over the body.  It can lead to tightening of muscles in the head and neck so you can't open your mouth, swallow, or sometimes even breathe. Tetanus kills about 1 out of 10 people who are infected even after receiving the best medical care. DIPHTHERIA is also rare in the [place] today. It can cause a thick coating to form in the back of the throat.  It can lead to breathing problems, heart failure, paralysis, and death. PERTUSSIS (Whooping Cough) causes severe coughing spells, which can cause difficulty breathing, vomiting and disturbed sleep.  It can also lead to weight loss, incontinence, and rib fractures. Up to 2 in 100 adolescents and 5 in 100 adults with pertussis are hospitalized or have complications, which could include pneumonia or death. These diseases are caused by bacteria. Diphtheria and pertussis are spread from person to person through secretions from coughing or sneezing. Tetanus enters the body through cuts, scratches, or wounds. Before vaccines, as many as 200,000 cases of diphtheria, 200,000 cases of pertussis, and hundreds of cases of tetanus, were reported in the [place] each year. Since vaccination began, reports of cases for tetanus and diphtheria have dropped by about 99% and for pertussis by about 80%. 2. Tdap vaccine Tdap vaccine can protect adolescents and adults from tetanus, diphtheria, and pertussis. One dose of Tdap is routinely given at age 72 or 24. People who did not get Tdap at that age should get it as soon as possible. Tdap is especially important for healthcare professionals and anyone having close  contact with a baby younger than 12 months. Pregnant women should get a dose of Tdap during every pregnancy, to protect the newborn from pertussis. Infants are most at risk for severe, life-threatening complications from pertussis. Another vaccine, called Td, protects against tetanus and diphtheria, but not pertussis. A Td booster should be given every 10 years. Tdap may be given as one of these boosters if you have never gotten Tdap before. Tdap may also be given after a severe cut or burn to prevent tetanus infection. Your doctor or the person giving you the vaccine can give you more information. Tdap may safely be given at the same time as other vaccines. 3. Some people should not get this vaccine  A person who has ever had a life-threatening allergic reaction after a previous dose  of any diphtheria, tetanus or pertussis containing vaccine, OR has a severe allergy to any part of this vaccine, should not get Tdap vaccine. Tell the person giving the vaccine about any severe allergies.  Anyone who had coma or long repeated seizures within 7 days after a childhood dose of DTP or DTaP, or a previous dose of Tdap, should not get Tdap, unless a cause other than the vaccine was found. They can still get Td.  Talk to your doctor if you: ? have seizures or another nervous system problem, ? had severe pain or swelling after any vaccine containing diphtheria, tetanus or pertussis, ? ever had a condition called Guillain-Barr Syndrome (GBS), ? aren't feeling well on the day the shot is scheduled. 4. Risks With any medicine, including vaccines, there is a chance of side effects. These are usually mild and go away on their own. Serious reactions are also possible but are rare. Most people who get Tdap vaccine do not have any problems with it. Mild problems following Tdap (Did not interfere with activities)  Pain where the shot was given (about 3 in 4 adolescents or 2 in 3 adults)  Redness or swelling  where the shot was given (about 1 person in 5)  Mild fever of at least 100.24F (up to about 1 in 25 adolescents or 1 in 100 adults)  Headache (about 3 or 4 people in 10)  Tiredness (about 1 person in 3 or 4)  Nausea, vomiting, diarrhea, stomach ache (up to 1 in 4 adolescents or 1 in 10 adults)  Chills, sore joints (about 1 person in 10)  Body aches (about 1 person in 3 or 4)  Rash, swollen glands (uncommon) Moderate problems following Tdap (Interfered with activities, but did not require medical attention)  Pain where the shot was given (up to 1 in 5 or 6)  Redness or swelling where the shot was given (up to about 1 in 16 adolescents or 1 in 12 adults)  Fever over 102F (about 1 in 100 adolescents or 1 in 250 adults)  Headache (about 1 in 7 adolescents or 1 in 10 adults)  Nausea, vomiting, diarrhea, stomach ache (up to 1 or 3 people in 100)  Swelling of the entire arm where the shot was given (up to about 1 in 500). Severe problems following Tdap (Unable to perform usual activities; required medical attention)  Swelling, severe pain, bleeding and redness in the arm where the shot was given (rare). Problems that could happen after any vaccine:  People sometimes faint after a medical procedure, including vaccination. Sitting or lying down for about 15 minutes can help prevent fainting, and injuries caused by a fall. Tell your doctor if you feel dizzy, or have vision changes or ringing in the ears.  Some people get severe pain in the shoulder and have difficulty moving the arm where a shot was given. This happens very rarely.  Any medication can cause a severe allergic reaction. Such reactions from a vaccine are very rare, estimated at fewer than 1 in a million doses, and would happen within a few minutes to a few hours after the vaccination. As with any medicine, there is a very remote chance of a vaccine causing a serious injury or death. The safety of vaccines is always being  monitored. For more information, visit: http://www.aguilar.org/ 5. What if there is a serious problem? What should I look for?  Look for anything that concerns you, such as signs of a severe allergic reaction,  very high fever, or unusual behavior. Signs of a severe allergic reaction can include hives, swelling of the face and throat, difficulty breathing, a fast heartbeat, dizziness, and weakness. These would usually start a few minutes to a few hours after the vaccination. What should I do?  If you think it is a severe allergic reaction or other emergency that can't wait, call 9-1-1 or get the person to the nearest hospital. Otherwise, call your doctor.  Afterward, the reaction should be reported to the Vaccine Adverse Event Reporting System (VAERS). Your doctor might file this report, or you can do it yourself through the VAERS web site at www.vaers.SamedayNews.es, or by calling 640-083-4743. VAERS does not give medical advice. 6. The National Vaccine Injury Compensation Program The [employer] Vaccine Injury Compensation Program (VICP) is a federal program that was created to compensate people who may have been injured by certain vaccines. Persons who believe they may have been injured by a vaccine can learn about the program and about filing a claim by calling 8254868380 or visiting the [place] website at GoldCloset.com.ee. There is a time limit to file a claim for compensation. 7. How can I learn more?  Ask your doctor. He or she can give you the vaccine package insert or suggest other sources of information.  Call your local or state health department.  Contact the Centers for Disease Control and Prevention (CDC): ? Call (215) 128-0354 (1-800-CDC-INFO) or ? Visit CDC's website at http://hunter.com/ Vaccine Information Statement Tdap Vaccine (01/30/2014) This information is not intended to replace advice given to you by your health care provider. Make sure you discuss any  questions you have with your health care provider. Document Released: 05/24/2012 Document Revised: 07/11/2018 Document Reviewed: 07/11/2018 Elsevier Interactive Patient Education  [employer].

## 2019-11-07 NOTE — Progress Notes (Signed)
BP 131/81   Pulse 79   Temp 99 F (37.2 C)   Ht 5' 3.62" (1.616 m)   Wt 177 lb 6 oz (80.5 kg)   SpO2 98%   BMI 30.81 kg/m    Subjective:    Patient ID: Julie Patel, female    DOB: February 21, 1966, 53 y.o.   MRN: [id number]  HPI: Julie Patel is a 53 y.o. female presenting on 11/07/2019 for comprehensive medical examination. Current medical complaints include:none  Menopausal Symptoms: yes- not bothering her much  Depression Screen done today and results listed below:  Depression screen [facility] 2/9 11/07/2019 12/02/2016  Decreased Interest 0 0  Down, Depressed, Hopeless 0 0  PHQ - 2 Score 0 0  Altered sleeping 0 -  Tired, decreased energy 0 -  Change in appetite 1 -  Feeling bad or failure about yourself  0 -  Trouble concentrating 0 -  Moving slowly or fidgety/restless 0 -  Suicidal thoughts 0 -  PHQ-9 Score 1 -  Difficult doing work/chores Not difficult at all -    Past Medical History:  Past Medical History:  Diagnosis Date  . Abnormal uterine bleeding   . Anemia   . GERD (gastroesophageal reflux disease)    occ  . Neck pain     Surgical History:  Past Surgical History:  Procedure Laterality Date  . [place] N/A 03/08/2017   Procedure: DILATATION & CURETTAGE/HYSTEROSCOPY WITH NOVASURE ABLATION;  Surgeon: Brayton Mars, MD;  Location: [facility] ORS;  Service: Gynecology;  Laterality: N/A;  . WISDOM TOOTH EXTRACTION      Medications:  Current Outpatient Medications on File Prior to Visit  Medication Sig  . ibuprofen (ADVIL,MOTRIN) 800 MG tablet Take 1 tablet (800 mg total) by mouth 3 (three) times daily.   No current facility-administered medications on file prior to visit.     Allergies:  Allergies  Allergen Reactions  . Codeine Nausea And Vomiting    Social History:  Social History   Socioeconomic History  . Marital status: Married    Spouse name: Not on file  . Number of children: Not on  file  . Years of education: Not on file  . Highest education level: Not on file  Occupational History  . Not on file  Social Needs  . Financial resource strain: Not on file  . Food insecurity    Worry: Not on file    Inability: Not on file  . Transportation needs    Medical: Not on file    Non-medical: Not on file  Tobacco Use  . Smoking status: Never Smoker  . Smokeless tobacco: Never Used  Substance and Sexual Activity  . Alcohol use: No  . Drug use: No  . Sexual activity: Not Currently  Lifestyle  . Physical activity    Days per week: Not on file    Minutes per session: Not on file  . Stress: Not on file  Relationships  . Social Herbalist on phone: Not on file    Gets together: Not on file    Attends religious service: Not on file    Active member of club or organization: Not on file    Attends meetings of clubs or organizations: Not on file    Relationship status: Not on file  . Intimate partner violence    Fear of current or ex partner: Not on file    Emotionally abused: Not on file  Physically abused: Not on file    Forced sexual activity: Not on file  Other Topics Concern  . Not on file  Social History Narrative  . Not on file   Social History   Tobacco Use  Smoking Status Never Smoker  Smokeless Tobacco Never Used   Social History   Substance and Sexual Activity  Alcohol Use No    Family History:  Family History  Problem Relation Age of Onset  . Alzheimer's disease Mother   . Cancer Father        Prostate  . Hypertension Brother   . Alzheimer's disease Maternal Grandmother   . Cancer Maternal Grandfather        Lung  . Diabetes Paternal Grandmother   . Stroke Paternal Grandfather   . Breast cancer Neg Hx   . Ovarian cancer Neg Hx   . Colon cancer Neg Hx     Past medical history, surgical history, medications, allergies, family history and social history reviewed with patient today and changes made to appropriate areas of the  chart.   Review of Systems  Constitutional: Negative.   HENT: Negative.   Eyes: Negative.   Respiratory: Negative.   Cardiovascular: Negative.   Gastrointestinal: Negative.   Genitourinary: Negative.   Musculoskeletal: Negative.   Skin: Negative.   Neurological: Negative.   Endo/Heme/Allergies: Negative.   Psychiatric/Behavioral: Negative.     All other ROS negative except what is listed above and in the HPI.      Objective:    BP 131/81   Pulse 79   Temp 99 F (37.2 C)   Ht 5' 3.62" (1.616 m)   Wt 177 lb 6 oz (80.5 kg)   SpO2 98%   BMI 30.81 kg/m   Wt Readings from Last 3 Encounters:  11/07/19 177 lb 6 oz (80.5 kg)  09/27/18 172 lb (78 kg)  03/17/17 171 lb 9.6 oz (77.8 kg)    Physical Exam Vitals signs and nursing note reviewed. Exam conducted with a chaperone present.  Constitutional:      General: She is not in acute distress.    Appearance: Normal appearance. She is not ill-appearing, toxic-appearing or diaphoretic.  HENT:     Head: Normocephalic and atraumatic.     Right Ear: Tympanic membrane, ear canal and external ear normal. There is no impacted cerumen.     Left Ear: Tympanic membrane, ear canal and external ear normal. There is no impacted cerumen.     Nose: Nose normal. No congestion or rhinorrhea.     Mouth/Throat:     Mouth: Mucous membranes are moist.     Pharynx: Oropharynx is clear. No oropharyngeal exudate or posterior oropharyngeal erythema.  Eyes:     General: No scleral icterus.       Right eye: No discharge.        Left eye: No discharge.     Extraocular Movements: Extraocular movements intact.     Conjunctiva/sclera: Conjunctivae normal.     Pupils: Pupils are equal, round, and reactive to light.  Neck:     Musculoskeletal: Normal range of motion and neck supple. No neck rigidity or muscular tenderness.     Vascular: No carotid bruit.  Cardiovascular:     Rate and Rhythm: Normal rate and regular rhythm.     Pulses: Normal pulses.      Heart sounds: No murmur. No friction rub. No gallop.   Pulmonary:     Effort: Pulmonary effort is normal. No respiratory distress.  Breath sounds: Normal breath sounds. No stridor. No wheezing, rhonchi or rales.  Chest:     Chest wall: No tenderness.     Breasts:        Right: Normal. No swelling, bleeding, inverted nipple, mass, nipple discharge, skin change or tenderness.        Left: Normal. No swelling, bleeding, inverted nipple, mass, nipple discharge, skin change or tenderness.  Abdominal:     General: Abdomen is flat. Bowel sounds are normal. There is no distension.     Palpations: Abdomen is soft. There is no mass.     Tenderness: There is no abdominal tenderness. There is no right CVA tenderness, left CVA tenderness, guarding or rebound.     Hernia: No hernia is present.  Genitourinary:    Labia:        Right: No rash, tenderness, lesion or injury.        Left: No rash, tenderness, lesion or injury.      Vagina: Normal.     Cervix: Normal.     Uterus: Normal.   Musculoskeletal:        General: No swelling, tenderness, deformity or signs of injury.     Right lower leg: No edema.     Left lower leg: No edema.  Lymphadenopathy:     Cervical: No cervical adenopathy.  Skin:    General: Skin is warm and dry.     Capillary Refill: Capillary refill takes less than 2 seconds.     Coloration: Skin is not jaundiced or pale.     Findings: No bruising, erythema, lesion or rash.  Neurological:     General: No focal deficit present.     Mental Status: She is alert and oriented to person, place, and time. Mental status is at baseline.     Cranial Nerves: No cranial nerve deficit.     Sensory: No sensory deficit.     Motor: No weakness.     Coordination: Coordination normal.     Gait: Gait normal.     Deep Tendon Reflexes: Reflexes normal.  Psychiatric:        Mood and Affect: Mood normal.        Behavior: Behavior normal.        Thought Content: Thought content normal.         Judgment: Judgment normal.     Results for orders placed or performed during the hospital encounter of 03/08/17  Pregnancy, urine POC  Result Value Ref Range   Preg Test, Ur NEGATIVE NEGATIVE  ABO/Rh  Result Value Ref Range   ABO/RH(D) O POS   Type and screen [facility]  Result Value Ref Range   ABO/RH(D) O POS    Antibody Screen NEG    Sample Expiration 03/11/2017   Surgical pathology  Result Value Ref Range   SURGICAL PATHOLOGY      Surgical Pathology CASE: [id number] PATIENT: Zandria Betters Surgical Pathology Report     SPECIMEN SUBMITTED: A. Endometrial curettings  CLINICAL HISTORY: None provided  PRE-OPERATIVE DIAGNOSIS: Uterine leiomyoma, iron deficiency anemia, menorrhagia, POL  POST-OPERATIVE DIAGNOSIS: Same as pre-op     DIAGNOSIS: A. ENDOMETRIUM; DILATATION AND CURETTAGE: - PROLIFERATIVE ENDOMETRIUM. - NEGATIVE FOR HYPERPLASIA AND CARCINOMA.   GROSS DESCRIPTION:  A. Labeled: endometrial curetting  Tissue fragment(s): multiple  Size: aggregate, 4.0 x 2.2 x 0.2 cm  Description: blood clot and pink fragments  Entirely submitted in 1-2 cassette(s).    Final Diagnosis performed by Dana Baker, MD.    Electronically signed 03/09/2017 12:24:57PM    The electronic signature indicates that the named Attending Pathologist has evaluated the specimen  Technical component performed at [facility], [address], [place], [place] [address] Lab: [phone] Dir: Darrick Penna. Gunnar Fusi, MD  Professional component performed at [facility], [facility], [place], [place], [place] [address] Lab: [phone] Dir: Dellia Nims. Reuel Derby, MD        Assessment & Plan:   Problem List Items Addressed This Visit    None    Visit Diagnoses    Routine general medical examination at a health care facility    -  Primary   Vaccines up to date. Screening labs checked today. Pap done. Mammogram and colonoscopy ordered  today. Continue diet and exercise. Call with any concerns.    Relevant Orders   CBC with Differential OUT   Comp Met (CMET)   Lipid Panel w/o Chol/HDL Ratio OUT   TSH   UA/M w/rflx Culture, Routine   Screening for cervical cancer       Pap done today.   Relevant Orders   Pap over 30   Screening for colon cancer       Referral for GI placed today.   Relevant Orders   Ambulatory referral to Gastroenterology   Immunization due       Flu and Tdap given today.   Relevant Orders   Tdap (Completed)       Follow up plan: Return in about 1 year (around 11/06/2020) for Physical.   LABORATORY TESTING:  - Pap smear: pap done  IMMUNIZATIONS:   - Tdap: Tetanus vaccination status reviewed: Tdap vaccination indicated and given today. - Influenza: Administered today - Pneumovax: Not applicable  SCREENING: -Mammogram: Order in- enouraged her to go  - Colonoscopy: Ordered today   PATIENT COUNSELING:   Advised to take 1 mg of folate supplement per day if capable of pregnancy.   Sexuality: Discussed sexually transmitted diseases, partner selection, use of condoms, avoidance of unintended pregnancy  and contraceptive alternatives.   Advised to avoid cigarette smoking.  I discussed with the patient that most people either abstain from alcohol or drink within safe limits (<=14/week and <=4 drinks/occasion for males, <=7/weeks and <= 3 drinks/occasion for females) and that the risk for alcohol disorders and other health effects rises proportionally with the number of drinks per week and how often a drinker exceeds daily limits.  Discussed cessation/primary prevention of drug use and availability of treatment for abuse.   Diet: Encouraged to adjust caloric intake to maintain  or achieve ideal body weight, to reduce intake of dietary saturated fat and total fat, to limit sodium intake by avoiding high sodium foods and not adding table salt, and to maintain adequate dietary potassium and calcium  preferably from fresh fruits, vegetables, and low-fat dairy products.    stressed the importance of regular exercise  Injury prevention: Discussed safety belts, safety helmets, smoke detector, smoking near bedding or upholstery.   Dental health: Discussed importance of regular tooth brushing, flossing, and dental visits.    NEXT PREVENTATIVE PHYSICAL DUE IN 1 YEAR. Return in about 1 year (around 11/06/2020) for Physical.

## 2019-11-08 ENCOUNTER — Encounter: Payer: Self-pay | Admitting: Family Medicine

## 2019-11-08 LAB — COMPREHENSIVE METABOLIC PANEL
ALT: 14 IU/L (ref 0–32)
AST: 18 IU/L (ref 0–40)
Albumin/Globulin Ratio: 2 (ref 1.2–2.2)
Albumin: 4.6 g/dL (ref 3.8–4.9)
Alkaline Phosphatase: 67 IU/L (ref 39–117)
BUN/Creatinine Ratio: 18 (ref 9–23)
BUN: 14 mg/dL (ref 6–24)
Bilirubin Total: 1 mg/dL (ref 0.0–1.2)
CO2: 22 mmol/L (ref 20–29)
Calcium: 9.4 mg/dL (ref 8.7–10.2)
Chloride: 102 mmol/L (ref 96–106)
Creatinine, Ser: 0.79 mg/dL (ref 0.57–1.00)
GFR calc Af Amer: 100 mL/min/{1.73_m2} (ref >59)
GFR calc non Af Amer: 87 mL/min/{1.73_m2} (ref >59)
Globulin, Total: 2.3 g/dL (ref 1.5–4.5)
Glucose: 100 mg/dL — ABNORMAL HIGH (ref 65–99)
Potassium: 3.9 mmol/L (ref 3.5–5.2)
Sodium: 140 mmol/L (ref 134–144)
Total Protein: 6.9 g/dL (ref 6.0–8.5)

## 2019-11-08 LAB — CBC WITH DIFFERENTIAL/PLATELET
Basophils Absolute: 0 10*3/uL (ref 0.0–0.2)
Basos: 1 %
EOS (ABSOLUTE): 0.1 10*3/uL (ref 0.0–0.4)
Eos: 1 %
Hematocrit: 38.2 % (ref 34.0–46.6)
Hemoglobin: 13.3 g/dL (ref 11.1–15.9)
Immature Grans (Abs): 0 10*3/uL (ref 0.0–0.1)
Immature Granulocytes: 0 %
Lymphocytes Absolute: 1.5 10*3/uL (ref 0.7–3.1)
Lymphs: 25 %
MCH: 29.6 pg (ref 26.6–33.0)
MCHC: 34.8 g/dL (ref 31.5–35.7)
MCV: 85 fL (ref 79–97)
Monocytes Absolute: 0.3 10*3/uL (ref 0.1–0.9)
Monocytes: 6 %
Neutrophils Absolute: 4.1 10*3/uL (ref 1.4–7.0)
Neutrophils: 67 %
Platelets: 248 10*3/uL (ref 150–450)
RBC: 4.5 x10E6/uL (ref 3.77–5.28)
RDW: 13 % (ref 11.7–15.4)
WBC: 6 10*3/uL (ref 3.4–10.8)

## 2019-11-08 LAB — TSH: TSH: 1.46 u[IU]/mL (ref 0.450–4.500)

## 2019-11-08 LAB — LIPID PANEL W/O CHOL/HDL RATIO
Cholesterol, Total: 316 mg/dL — ABNORMAL HIGH (ref 100–199)
HDL: 41 mg/dL (ref >39)
LDL Chol Calc (NIH): 204 mg/dL — ABNORMAL HIGH (ref 0–99)
Triglycerides: 345 mg/dL — ABNORMAL HIGH (ref 0–149)
VLDL Cholesterol Cal: 71 mg/dL — ABNORMAL HIGH (ref 5–40)

## 2019-11-10 LAB — CYTOLOGY - PAP
Comment: NEGATIVE
Diagnosis: NEGATIVE
High risk HPV: NEGATIVE

## 2019-11-22 ENCOUNTER — Encounter: Payer: Self-pay | Admitting: *Deleted

## 2019-12-29 ENCOUNTER — Encounter: Payer: Self-pay | Admitting: Physician Assistant

## 2019-12-29 ENCOUNTER — Telehealth: Payer: BC Managed Care – PPO | Admitting: Physician Assistant

## 2019-12-29 DIAGNOSIS — R109 Unspecified abdominal pain: Secondary | ICD-10-CM

## 2019-12-29 DIAGNOSIS — N939 Abnormal uterine and vaginal bleeding, unspecified: Secondary | ICD-10-CM

## 2019-12-29 NOTE — Progress Notes (Signed)
Based on what you shared with me, I feel your condition warrants further evaluation and I recommend that you be seen for a face to face office visit.  Ms. Julie Patel,  Sorry to hear your are not feeling well! Due to the severity of your pain and not getting comfortable with the medication you have taken, I recommend having a face to face evaluation with your primary doctor or Gyn, and if you are unable to, then evaluation at an urgent care center for your symptoms.   NOTE: If you entered your credit card information for this eVisit, you will not be charged. You may see a "hold" on your card for the $35 but that hold will drop off and you will not have a charge processed.   If you are having a true medical emergency please call 911.      For an urgent face to face visit, [facility] has five urgent care centers for your convenience:      NEW:  [facility] [place] [facility] [place] Get Driving Directions [id number] [place], [place] [address] . 10 am - 6pm Monday - Friday    [facility]) Get Driving Directions [id number] [address] [place], [place] [address] . 10 am to 8 pm Monday-Friday . 12 pm to 8 pm [facility] at [facility] Get Driving Directions [id number] [place], [place], [place] [address] . 8 am to 8 pm Monday-Friday . 9 am to 6 pm Saturday . 11 am to 6 pm Sunday     [facility] at [facility] Get Driving Directions  [id number] [address] [place], [place] [address] . 8 am to 8 pm Monday-Friday . 8 am to 4 pm [facility] at [place] Get Driving Directions [id number] [address] [place], [place] [address] . 12 pm to 6 pm Monday-Friday      Your e-visit answers were reviewed by a board certified advanced clinical practitioner to complete your personal  care plan.  Thank you for using e-Visits.    I spent 5-10 minutes on review and completion of this note- [name]

## 2020-01-02 ENCOUNTER — Other Ambulatory Visit: Payer: Self-pay

## 2020-01-02 ENCOUNTER — Encounter: Payer: Self-pay | Admitting: Family Medicine

## 2020-01-02 ENCOUNTER — Ambulatory Visit: Payer: BC Managed Care – PPO | Admitting: Family Medicine

## 2020-01-02 ENCOUNTER — Encounter: Payer: Self-pay | Admitting: *Deleted

## 2020-01-02 VITALS — BP 123/74 | HR 62 | Temp 98.3°F | Ht 63.6 in | Wt 176.0 lb

## 2020-01-02 DIAGNOSIS — N946 Dysmenorrhea, unspecified: Secondary | ICD-10-CM | POA: Diagnosis not present

## 2020-01-02 MED ORDER — NAPROXEN 500 MG PO TABS: 500.0000 mg | ORAL_TABLET | Freq: Two times a day (BID) | ORAL | 0 refills | Status: DC

## 2020-01-02 NOTE — Progress Notes (Signed)
BP 123/74   Pulse 62   Temp 98.3 F (36.8 C) (Oral)   Ht 5' 3.6" (1.615 m)   Wt 176 lb (79.8 kg)   SpO2 99%   BMI 30.59 kg/m    Subjective:    Patient ID: Julie Patel, female    DOB: [birth year], 54 y.o.   MRN: [id number]  HPI: Julie Patel is a 54 y.o. female  Chief Complaint  Patient presents with  . Abdominal Pain    lower. cramps and spotting since last Friday   Had some bad cramping and spottting starting about 6 days ago. Hasn't had that happen in a couple of years. Had this happen around the time she was due for her cycle after her ablation. Had an ablation done in 2018. Had really bad cramps about 3-4 days ago. Had to lay on the couch with a heating pad. Not resolving with tylenol or ibuprofen. Ibuprofen helped to take the edge off, but didn't totally resolve. Yesterday, it started feeling a bit better. Today she is feeling totally normal. She notes that her pain was in her lower abdomen closer to the L side. It radiated into her back and into the top of her legs. It came on suddenly and woke her up from sleep. She is otherwise feeling well with no other concerns or complaints at this time.   Relevant past medical, surgical, family and social history reviewed and updated as indicated. Interim medical history since our last visit reviewed. Allergies and medications reviewed and updated.  Review of Systems  Constitutional: Negative.   Respiratory: Negative.   Cardiovascular: Negative.   Genitourinary: Positive for menstrual problem, pelvic pain and vaginal bleeding. Negative for decreased urine volume, difficulty urinating, dyspareunia, dysuria, enuresis, flank pain, frequency, genital sores, hematuria, urgency, vaginal discharge and vaginal pain.  Musculoskeletal: Negative.   Neurological: Negative.   Psychiatric/Behavioral: Negative.     Per HPI unless specifically indicated above     Objective:    BP 123/74   Pulse 62   Temp 98.3 F (36.8 C) (Oral)    Ht 5' 3.6" (1.615 m)   Wt 176 lb (79.8 kg)   SpO2 99%   BMI 30.59 kg/m   Wt Readings from Last 3 Encounters:  01/02/20 176 lb (79.8 kg)  11/07/19 177 lb 6 oz (80.5 kg)  09/27/18 172 lb (78 kg)    Physical Exam Vitals and nursing note reviewed.  Constitutional:      General: She is not in acute distress.    Appearance: Normal appearance. She is not ill-appearing, toxic-appearing or diaphoretic.  HENT:     Head: Normocephalic and atraumatic.     Right Ear: External ear normal.     Left Ear: External ear normal.     Nose: Nose normal.     Mouth/Throat:     Mouth: Mucous membranes are moist.     Pharynx: Oropharynx is clear.  Eyes:     General: No scleral icterus.       Right eye: No discharge.        Left eye: No discharge.     Extraocular Movements: Extraocular movements intact.     Conjunctiva/sclera: Conjunctivae normal.     Pupils: Pupils are equal, round, and reactive to light.  Cardiovascular:     Rate and Rhythm: Normal rate and regular rhythm.     Pulses: Normal pulses.     Heart sounds: Normal heart sounds. No murmur. No friction rub. No gallop.  Pulmonary:     Effort: Pulmonary effort is normal. No respiratory distress.     Breath sounds: Normal breath sounds. No stridor. No wheezing, rhonchi or rales.  Chest:     Chest wall: No tenderness.  Musculoskeletal:        General: Normal range of motion.     Cervical back: Normal range of motion and neck supple.  Skin:    General: Skin is warm and dry.     Capillary Refill: Capillary refill takes less than 2 seconds.     Coloration: Skin is not jaundiced or pale.     Findings: No bruising, erythema, lesion or rash.  Neurological:     General: No focal deficit present.     Mental Status: She is alert and oriented to person, place, and time. Mental status is at baseline.  Psychiatric:        Mood and Affect: Mood normal.        Behavior: Behavior normal.        Thought Content: Thought content normal.         Judgment: Judgment normal.     Results for orders placed or performed in visit on 11/07/19  CBC with Differential OUT  Result Value Ref Range   WBC 6.0 3.4 - 10.8 x10E3/uL   RBC 4.50 3.77 - 5.28 x10E6/uL   Hemoglobin 13.3 11.1 - 15.9 g/dL   Hematocrit 38.2 34.0 - 46.6 %   MCV 85 79 - 97 fL   MCH 29.6 26.6 - 33.0 pg   MCHC 34.8 31.5 - 35.7 g/dL   RDW 13.0 11.7 - 15.4 %   Platelets 248 150 - 450 x10E3/uL   Neutrophils 67 Not Estab. %   Lymphs 25 Not Estab. %   Monocytes 6 Not Estab. %   Eos 1 Not Estab. %   Basos 1 Not Estab. %   Neutrophils Absolute 4.1 1.4 - 7.0 x10E3/uL   Lymphocytes Absolute 1.5 0.7 - 3.1 x10E3/uL   Monocytes Absolute 0.3 0.1 - 0.9 x10E3/uL   EOS (ABSOLUTE) 0.1 0.0 - 0.4 x10E3/uL   Basophils Absolute 0.0 0.0 - 0.2 x10E3/uL   Immature Granulocytes 0 Not Estab. %   Immature Grans (Abs) 0.0 0.0 - 0.1 x10E3/uL  Comp Met (CMET)  Result Value Ref Range   Glucose 100 (H) 65 - 99 mg/dL   BUN 14 6 - 24 mg/dL   Creatinine, Ser 0.79 0.57 - 1.00 mg/dL   GFR calc non Af Amer 87 >59 mL/min/1.73   GFR calc Af Amer 100 >59 mL/min/1.73   BUN/Creatinine Ratio 18 9 - 23   Sodium 140 134 - 144 mmol/L   Potassium 3.9 3.5 - 5.2 mmol/L   Chloride 102 96 - 106 mmol/L   CO2 22 20 - 29 mmol/L   Calcium 9.4 8.7 - 10.2 mg/dL   Total Protein 6.9 6.0 - 8.5 g/dL   Albumin 4.6 3.8 - 4.9 g/dL   Globulin, Total 2.3 1.5 - 4.5 g/dL   Albumin/Globulin Ratio 2.0 1.2 - 2.2   Bilirubin Total 1.0 0.0 - 1.2 mg/dL   Alkaline Phosphatase 67 39 - 117 IU/L   AST 18 0 - 40 IU/L   ALT 14 0 - 32 IU/L  Lipid Panel w/o Chol/HDL Ratio OUT  Result Value Ref Range   Cholesterol, Total 316 (H) 100 - 199 mg/dL   Triglycerides 345 (H) 0 - 149 mg/dL   HDL 41 >39 mg/dL   VLDL Cholesterol Cal 71 (H) 5 -  40 mg/dL   LDL Chol Calc (NIH) 204 (H) 0 - 99 mg/dL  TSH  Result Value Ref Range   TSH 1.460 0.450 - 4.500 uIU/mL  UA/M w/rflx Culture, Routine   Specimen: Urine   URINE  Result Value Ref Range     Specific Gravity, UA 1.010 1.005 - 1.030   pH, UA 5.5 5.0 - 7.5   Color, UA Yellow Yellow   Appearance Ur Clear Clear   Leukocytes,UA Negative Negative   Protein,UA Negative Negative/Trace   Glucose, UA Negative Negative   Ketones, UA Negative Negative   RBC, UA Negative Negative   Bilirubin, UA Negative Negative   Urobilinogen, Ur 0.2 0.2 - 1.0 mg/dL   Nitrite, UA Negative Negative  Pap over 30  Result Value Ref Range   High risk HPV Negative    Adequacy      Satisfactory for evaluation; transformation zone component PRESENT.   Diagnosis      - Negative for intraepithelial lesion or malignancy (NILM)   Comment Normal Reference Range HPV - Negative       Assessment & Plan:   Problem List Items Addressed This Visit    None    Visit Diagnoses    Dysmenorrhea    -  Primary   Discussed labwork and imaging- she'd like to hold off for now. If it happens again, will obtain TV US. Naproxen for comfort. Call with any concerns.        Follow up plan: Return if symptoms worsen or fail to improve.

## 2020-11-12 ENCOUNTER — Ambulatory Visit (INDEPENDENT_AMBULATORY_CARE_PROVIDER_SITE_OTHER): Payer: BC Managed Care – PPO | Admitting: Family Medicine

## 2020-11-12 ENCOUNTER — Encounter: Payer: Self-pay | Admitting: Family Medicine

## 2020-11-12 ENCOUNTER — Other Ambulatory Visit: Payer: Self-pay

## 2020-11-12 VITALS — BP 131/78 | HR 62 | Temp 98.2°F | Ht 64.0 in | Wt 165.0 lb

## 2020-11-12 DIAGNOSIS — Z1211 Encounter for screening for malignant neoplasm of colon: Secondary | ICD-10-CM | POA: Diagnosis not present

## 2020-11-12 DIAGNOSIS — Z1231 Encounter for screening mammogram for malignant neoplasm of breast: Secondary | ICD-10-CM

## 2020-11-12 DIAGNOSIS — R8281 Pyuria: Secondary | ICD-10-CM

## 2020-11-12 DIAGNOSIS — Z1283 Encounter for screening for malignant neoplasm of skin: Secondary | ICD-10-CM

## 2020-11-12 DIAGNOSIS — Z0000 Encounter for general adult medical examination without abnormal findings: Secondary | ICD-10-CM

## 2020-11-12 LAB — URINALYSIS, ROUTINE W REFLEX MICROSCOPIC
Bilirubin, UA: NEGATIVE
Glucose, UA: NEGATIVE
Ketones, UA: NEGATIVE
Nitrite, UA: NEGATIVE
Protein,UA: NEGATIVE
RBC, UA: NEGATIVE
Specific Gravity, UA: 1.025 (ref 1.005–1.030)
Urobilinogen, Ur: 0.2 mg/dL (ref 0.2–1.0)
pH, UA: 7 (ref 5.0–7.5)

## 2020-11-12 LAB — MICROSCOPIC EXAMINATION
Bacteria, UA: NONE SEEN
RBC, Urine: NONE SEEN /hpf (ref 0–2)

## 2020-11-12 NOTE — Patient Instructions (Addendum)
Call to schedule your mammogram  [facility]  Address: [address], [place], [place] [address]  Phone: [phone]   Health Maintenance, Female Adopting a healthy lifestyle and getting preventive care are important in promoting health and wellness. Ask your health care provider about:  The right schedule for you to have regular tests and exams.  Things you can do on your own to prevent diseases and keep yourself healthy. What should I know about diet, weight, and exercise? Eat a healthy diet   Eat a diet that includes plenty of vegetables, fruits, low-fat dairy products, and lean protein.  Do not eat a lot of foods that are high in solid fats, added sugars, or sodium. Maintain a healthy weight Body mass index (BMI) is used to identify weight problems. It estimates body fat based on height and weight. Your health care provider can help determine your BMI and help you achieve or maintain a healthy weight. Get regular exercise Get regular exercise. This is one of the most important things you can do for your health. Most adults should:  Exercise for at least 150 minutes each week. The exercise should increase your heart rate and make you sweat (moderate-intensity exercise).  Do strengthening exercises at least twice a week. This is in addition to the moderate-intensity exercise.  Spend less time sitting. Even light physical activity can be beneficial. Watch cholesterol and blood lipids Have your blood tested for lipids and cholesterol at 52 years of age, then have this test every 5 years. Have your cholesterol levels checked more often if:  Your lipid or cholesterol levels are high.  You are older than 52 years of age.  You are at high risk for heart disease. What should I know about cancer screening? Depending on your health history and family history, you may need to have cancer screening at various ages. This may include screening  for:  Breast cancer.  Cervical cancer.  Colorectal cancer.  Skin cancer.  Lung cancer. What should I know about heart disease, diabetes, and high blood pressure? Blood pressure and heart disease  High blood pressure causes heart disease and increases the risk of stroke. This is more likely to develop in people who have high blood pressure readings, are of African descent, or are overweight.  Have your blood pressure checked: ? Every 3-5 years if you are 18-39 years of age. ? Every year if you are 40 years old or older. Diabetes Have regular diabetes screenings. This checks your fasting blood sugar level. Have the screening done:  Once every three years after age 40 if you are at a normal weight and have a low risk for diabetes.  More often and at a younger age if you are overweight or have a high risk for diabetes. What should I know about preventing infection? Hepatitis B If you have a higher risk for hepatitis B, you should be screened for this virus. Talk with your health care provider to find out if you are at risk for hepatitis B infection. Hepatitis C Testing is recommended for:  Everyone born from 1945 through 1965.  Anyone with known risk factors for hepatitis C. Sexually transmitted infections (STIs)  Get screened for STIs, including gonorrhea and chlamydia, if: ? You are sexually active and are younger than 52 years of age. ? You are older than 52 years of age and your health care provider tells you that you are at risk for this type of   infection. ? Your sexual activity has changed since you were last screened, and you are at increased risk for chlamydia or gonorrhea. Ask your health care provider if you are at risk.  Ask your health care provider about whether you are at high risk for HIV. Your health care provider may recommend a prescription medicine to help prevent HIV infection. If you choose to take medicine to prevent HIV, you should first get tested for HIV.  You should then be tested every 3 months for as long as you are taking the medicine. Pregnancy  If you are about to stop having your period (premenopausal) and you may become pregnant, seek counseling before you get pregnant.  Take 400 to 800 micrograms (mcg) of folic acid every day if you become pregnant.  Ask for birth control (contraception) if you want to prevent pregnancy. Osteoporosis and menopause Osteoporosis is a disease in which the bones lose minerals and strength with aging. This can result in bone fractures. If you are 65 years old or older, or if you are at risk for osteoporosis and fractures, ask your health care provider if you should:  Be screened for bone loss.  Take a calcium or vitamin D supplement to lower your risk of fractures.  Be given hormone replacement therapy (HRT) to treat symptoms of menopause. Follow these instructions at home: Lifestyle  Do not use any products that contain nicotine or tobacco, such as cigarettes, e-cigarettes, and chewing tobacco. If you need help quitting, ask your health care provider.  Do not use street drugs.  Do not share needles.  Ask your health care provider for help if you need support or information about quitting drugs. Alcohol use  Do not drink alcohol if: ? Your health care provider tells you not to drink. ? You are pregnant, may be pregnant, or are planning to become pregnant.  If you drink alcohol: ? Limit how much you use to 0-1 drink a day. ? Limit intake if you are breastfeeding.  Be aware of how much alcohol is in your drink. In the [place] one drink equals one 12 oz bottle of beer (355 mL), one 5 oz glass of wine (148 mL), or one 1 oz glass of hard liquor (44 mL). General instructions  Schedule regular health, dental, and eye exams.  Stay current with your vaccines.  Tell your health care provider if: ? You often feel depressed. ? You have ever been abused or do not feel safe at  home. Summary  Adopting a healthy lifestyle and getting preventive care are important in promoting health and wellness.  Follow your health care provider's instructions about healthy diet, exercising, and getting tested or screened for diseases.  Follow your health care provider's instructions on monitoring your cholesterol and blood pressure. This information is not intended to replace advice given to you by your health care provider. Make sure you discuss any questions you have with your health care provider. Document Revised: 11/16/2018 Document Reviewed: 11/16/2018 Elsevier Patient Education  2020 [employer]vier Inc[employer]  

## 2020-11-12 NOTE — Addendum Note (Signed)
Addended by: Valerie Roys on: 11/12/2020 04:12 PM   Modules accepted: Orders

## 2020-11-12 NOTE — Progress Notes (Signed)
BP 131/78   Pulse 62   Temp 98.2 F (36.8 C)   Ht 5' 4" (1.626 m)   Wt 165 lb (74.8 kg)   SpO2 100%   BMI 28.32 kg/m    Subjective:    Patient ID: Julie Patel, female    DOB: 1966-09-27, 54 y.o.   MRN: [id number]  HPI: Julie Patel is a 54 y.o. female presenting on 11/12/2020 for comprehensive medical examination. Current medical complaints include:none  Menopausal Symptoms: no  Depression Screen done today and results listed below:  Depression screen [facility] 2/9 11/12/2020 11/07/2019 12/02/2016  Decreased Interest 0 0 0  Down, Depressed, Hopeless 0 0 0  PHQ - 2 Score 0 0 0  Altered sleeping - 0 -  Tired, decreased energy - 0 -  Change in appetite - 1 -  Feeling bad or failure about yourself  - 0 -  Trouble concentrating - 0 -  Moving slowly or fidgety/restless - 0 -  Suicidal thoughts - 0 -  PHQ-9 Score - 1 -  Difficult doing work/chores - Not difficult at all -     Past Medical History:  Past Medical History:  Diagnosis Date  . Abnormal uterine bleeding   . Anemia   . GERD (gastroesophageal reflux disease)    occ  . Neck pain     Surgical History:  Past Surgical History:  Procedure Laterality Date  . [place] N/A 03/08/2017   Procedure: DILATATION & CURETTAGE/HYSTEROSCOPY WITH NOVASURE ABLATION;  Surgeon: Brayton Mars, MD;  Location: [facility] ORS;  Service: Gynecology;  Laterality: N/A;  . WISDOM TOOTH EXTRACTION      Medications:  Current Outpatient Medications on File Prior to Visit  Medication Sig  . ibuprofen (ADVIL,MOTRIN) 800 MG tablet Take 1 tablet (800 mg total) by mouth 3 (three) times daily. (Patient not taking: Reported on 11/12/2020)  . naproxen (NAPROSYN) 500 MG tablet Take 1 tablet (500 mg total) by mouth 2 (two) times daily with a meal. Do not take with ibuprofen- OK to take with tylenol (Patient not taking: Reported on 11/12/2020)   No current facility-administered medications on  file prior to visit.    Allergies:  Allergies  Allergen Reactions  . Codeine Nausea And Vomiting    Social History:  Social History   Socioeconomic History  . Marital status: Married    Spouse name: Not on file  . Number of children: Not on file  . Years of education: Not on file  . Highest education level: Not on file  Occupational History  . Not on file  Tobacco Use  . Smoking status: Never Smoker  . Smokeless tobacco: Never Used  Vaping Use  . Vaping Use: Never used  Substance and Sexual Activity  . Alcohol use: No  . Drug use: No  . Sexual activity: Not Currently  Other Topics Concern  . Not on file  Social History Narrative  . Not on file   Social Determinants of Health   Financial Resource Strain:   . Difficulty of Paying Living Expenses: Not on file  Food Insecurity:   . Worried About Charity fundraiser in the Last Year: Not on file  . Ran Out of Food in the Last Year: Not on file  Transportation Needs:   . Lack of Transportation (Medical): Not on file  . Lack of Transportation (Non-Medical): Not on file  Physical Activity:   . Days of Exercise per Week: Not on  file  . Minutes of Exercise per Session: Not on file  Stress:   . Feeling of Stress : Not on file  Social Connections:   . Frequency of Communication with Friends and Family: Not on file  . Frequency of Social Gatherings with Friends and Family: Not on file  . Attends Religious Services: Not on file  . Active Member of Clubs or Organizations: Not on file  . Attends Archivist Meetings: Not on file  . Marital Status: Not on file  Intimate Partner Violence:   . Fear of Current or Ex-Partner: Not on file  . Emotionally Abused: Not on file  . Physically Abused: Not on file  . Sexually Abused: Not on file   Social History   Tobacco Use  Smoking Status Never Smoker  Smokeless Tobacco Never Used   Social History   Substance and Sexual Activity  Alcohol Use No    Family  History:  Family History  Problem Relation Age of Onset  . Alzheimer's disease Mother   . Cancer Father        Prostate  . Hypertension Brother   . Alzheimer's disease Maternal Grandmother   . Cancer Maternal Grandfather        Lung  . Diabetes Paternal Grandmother   . Stroke Paternal Grandfather   . Breast cancer Neg Hx   . Ovarian cancer Neg Hx   . Colon cancer Neg Hx     Past medical history, surgical history, medications, allergies, family history and social history reviewed with patient today and changes made to appropriate areas of the chart.   Review of Systems  Constitutional: Negative.   HENT: Negative.   Eyes: Negative.   Respiratory: Negative.   Cardiovascular: Negative.   Gastrointestinal: Negative.   Genitourinary: Negative.   Musculoskeletal: Negative.   Skin: Negative.        Couple of skin tags  Neurological: Negative.   Endo/Heme/Allergies: Negative.   Psychiatric/Behavioral: Negative.     All other ROS negative except what is listed above and in the HPI.      Objective:    BP 131/78   Pulse 62   Temp 98.2 F (36.8 C)   Ht 5' 4" (1.626 m)   Wt 165 lb (74.8 kg)   SpO2 100%   BMI 28.32 kg/m   Wt Readings from Last 3 Encounters:  11/12/20 165 lb (74.8 kg)  01/02/20 176 lb (79.8 kg)  11/07/19 177 lb 6 oz (80.5 kg)    Physical Exam Vitals and nursing note reviewed.  Constitutional:      General: She is not in acute distress.    Appearance: Normal appearance. She is not ill-appearing, toxic-appearing or diaphoretic.  HENT:     Head: Normocephalic and atraumatic.     Right Ear: Tympanic membrane, ear canal and external ear normal. There is no impacted cerumen.     Left Ear: Tympanic membrane, ear canal and external ear normal. There is no impacted cerumen.     Nose: Nose normal. No congestion or rhinorrhea.     Mouth/Throat:     Mouth: Mucous membranes are moist.     Pharynx: Oropharynx is clear. No oropharyngeal exudate or posterior  oropharyngeal erythema.  Eyes:     General: No scleral icterus.       Right eye: No discharge.        Left eye: No discharge.     Extraocular Movements: Extraocular movements intact.     Conjunctiva/sclera: Conjunctivae normal.  Pupils: Pupils are equal, round, and reactive to light.  Neck:     Vascular: No carotid bruit.  Cardiovascular:     Rate and Rhythm: Normal rate and regular rhythm.     Pulses: Normal pulses.     Heart sounds: No murmur heard.  No friction rub. No gallop.   Pulmonary:     Effort: Pulmonary effort is normal. No respiratory distress.     Breath sounds: Normal breath sounds. No stridor. No wheezing, rhonchi or rales.  Chest:     Chest wall: No tenderness.  Abdominal:     General: Abdomen is flat. Bowel sounds are normal. There is no distension.     Palpations: Abdomen is soft. There is no mass.     Tenderness: There is no abdominal tenderness. There is no right CVA tenderness, left CVA tenderness, guarding or rebound.     Hernia: No hernia is present.  Genitourinary:    Comments: Breast and pelvic exams deferred with shared decision making Musculoskeletal:        General: No swelling, tenderness, deformity or signs of injury.     Cervical back: Normal range of motion and neck supple. No rigidity. No muscular tenderness.     Right lower leg: No edema.     Left lower leg: No edema.  Lymphadenopathy:     Cervical: No cervical adenopathy.  Skin:    General: Skin is warm and dry.     Capillary Refill: Capillary refill takes less than 2 seconds.     Coloration: Skin is not jaundiced or pale.     Findings: No bruising, erythema, lesion or rash.     Comments: Small AK on L side of her nose and on back of L calf  Neurological:     General: No focal deficit present.     Mental Status: She is alert and oriented to person, place, and time. Mental status is at baseline.     Cranial Nerves: No cranial nerve deficit.     Sensory: No sensory deficit.     Motor:  No weakness.     Coordination: Coordination normal.     Gait: Gait normal.     Deep Tendon Reflexes: Reflexes normal.  Psychiatric:        Mood and Affect: Mood normal.        Behavior: Behavior normal.        Thought Content: Thought content normal.        Judgment: Judgment normal.     Results for orders placed or performed in visit on 11/07/19  CBC with Differential OUT  Result Value Ref Range   WBC 6.0 3.4 - 10.8 x10E3/uL   RBC 4.50 3.77 - 5.28 x10E6/uL   Hemoglobin 13.3 11.1 - 15.9 g/dL   Hematocrit 38.2 34.0 - 46.6 %   MCV 85 79 - 97 fL   MCH 29.6 26.6 - 33.0 pg   MCHC 34.8 31 - 35 g/dL   RDW 13.0 11.7 - 15.4 %   Platelets 248 150 - 450 x10E3/uL   Neutrophils 67 Not Estab. %   Lymphs 25 Not Estab. %   Monocytes 6 Not Estab. %   Eos 1 Not Estab. %   Basos 1 Not Estab. %   Neutrophils Absolute 4.1 1.40 - 7.00 x10E3/uL   Lymphocytes Absolute 1.5 0 - 3 x10E3/uL   Monocytes Absolute 0.3 0 - 0 x10E3/uL   EOS (ABSOLUTE) 0.1 0.0 - 0.4 x10E3/uL   Basophils Absolute 0.0 0 -  0 x10E3/uL   Immature Granulocytes 0 Not Estab. %   Immature Grans (Abs) 0.0 0.0 - 0.1 x10E3/uL  Comp Met (CMET)  Result Value Ref Range   Glucose 100 (H) 65 - 99 mg/dL   BUN 14 6 - 24 mg/dL   Creatinine, Ser 0.79 0.57 - 1.00 mg/dL   GFR calc non Af Amer 87 >59 mL/min/1.73   GFR calc Af Amer 100 >59 mL/min/1.73   BUN/Creatinine Ratio 18 9 - 23   Sodium 140 134 - 144 mmol/L   Potassium 3.9 3.5 - 5.2 mmol/L   Chloride 102 96 - 106 mmol/L   CO2 22 20 - 29 mmol/L   Calcium 9.4 8.7 - 10.2 mg/dL   Total Protein 6.9 6.0 - 8.5 g/dL   Albumin 4.6 3.8 - 4.9 g/dL   Globulin, Total 2.3 1.5 - 4.5 g/dL   Albumin/Globulin Ratio 2.0 1.2 - 2.2   Bilirubin Total 1.0 0.0 - 1.2 mg/dL   Alkaline Phosphatase 67 39 - 117 IU/L   AST 18 0 - 40 IU/L   ALT 14 0 - 32 IU/L  Lipid Panel w/o Chol/HDL Ratio OUT  Result Value Ref Range   Cholesterol, Total 316 (H) 100 - 199 mg/dL   Triglycerides 345 (H) 0 - 149 mg/dL   HDL  41 >39 mg/dL   VLDL Cholesterol Cal 71 (H) 5 - 40 mg/dL   LDL Chol Calc (NIH) 204 (H) 0 - 99 mg/dL  TSH  Result Value Ref Range   TSH 1.460 0.450 - 4.500 uIU/mL  UA/M w/rflx Culture, Routine   Specimen: Urine   URINE  Result Value Ref Range   Specific Gravity, UA 1.010 1.005 - 1.030   pH, UA 5.5 5.0 - 7.5   Color, UA Yellow Yellow   Appearance Ur Clear Clear   Leukocytes,UA Negative Negative   Protein,UA Negative Negative/Trace   Glucose, UA Negative Negative   Ketones, UA Negative Negative   RBC, UA Negative Negative   Bilirubin, UA Negative Negative   Urobilinogen, Ur 0.2 0.2 - 1.0 mg/dL   Nitrite, UA Negative Negative  Pap over 30  Result Value Ref Range   High risk HPV Negative    Adequacy      Satisfactory for evaluation; transformation zone component PRESENT.   Diagnosis      - Negative for intraepithelial lesion or malignancy (NILM)   Comment Normal Reference Range HPV - Negative       Assessment & Plan:   Problem List Items Addressed This Visit    None    Visit Diagnoses    Routine general medical examination at a health care facility    -  Primary   Vaccines up to date/declined. Screening labs checked today. Pap up to date. Mammogram and colonoscopy ordered today. Continue diet and exercise. Call w/ concern   Relevant Orders   CBC with Differential/Platelet   Comprehensive metabolic panel   Lipid Panel w/o Chol/HDL Ratio   TSH   Urinalysis, Routine w reflex microscopic   Hepatitis C Antibody   Encounter for screening mammogram for malignant neoplasm of breast       Mammogram ordered today.    Relevant Orders   MM 3D SCREEN BREAST BILATERAL   Screening for skin cancer       Referral to dermatology made today.   Relevant Orders   Ambulatory referral to Dermatology   Screening for colon cancer       Cologuard ordered today.   Relevant  Orders   Cologuard       Follow up plan: Return in about 1 year (around 11/12/2021) for physical.   LABORATORY  TESTING:  - Pap smear: up to date  IMMUNIZATIONS:   - Tdap: Tetanus vaccination status reviewed: last tetanus booster within 10 years. - Influenza: Up to date - Pneumovax: Not applicable - COVID: Refused  SCREENING: -Mammogram: Ordered today  - Colonoscopy: Ordered today   PATIENT COUNSELING:   Advised to take 1 mg of folate supplement per day if capable of pregnancy.   Sexuality: Discussed sexually transmitted diseases, partner selection, use of condoms, avoidance of unintended pregnancy  and contraceptive alternatives.   Advised to avoid cigarette smoking.  I discussed with the patient that most people either abstain from alcohol or drink within safe limits (<=14/week and <=4 drinks/occasion for males, <=7/weeks and <= 3 drinks/occasion for females) and that the risk for alcohol disorders and other health effects rises proportionally with the number of drinks per week and how often a drinker exceeds daily limits.  Discussed cessation/primary prevention of drug use and availability of treatment for abuse.   Diet: Encouraged to adjust caloric intake to maintain  or achieve ideal body weight, to reduce intake of dietary saturated fat and total fat, to limit sodium intake by avoiding high sodium foods and not adding table salt, and to maintain adequate dietary potassium and calcium preferably from fresh fruits, vegetables, and low-fat dairy products.    stressed the importance of regular exercise  Injury prevention: Discussed safety belts, safety helmets, smoke detector, smoking near bedding or upholstery.   Dental health: Discussed importance of regular tooth brushing, flossing, and dental visits.    NEXT PREVENTATIVE PHYSICAL DUE IN 1 YEAR. Return in about 1 year (around 11/12/2021) for physical.

## 2020-11-13 LAB — CBC WITH DIFFERENTIAL/PLATELET
Basophils Absolute: 0 10*3/uL (ref 0.0–0.2)
Basos: 1 %
EOS (ABSOLUTE): 0.1 10*3/uL (ref 0.0–0.4)
Eos: 1 %
Hematocrit: 36.3 % (ref 34.0–46.6)
Hemoglobin: 12.7 g/dL (ref 11.1–15.9)
Immature Grans (Abs): 0 10*3/uL (ref 0.0–0.1)
Immature Granulocytes: 0 %
Lymphocytes Absolute: 1.6 10*3/uL (ref 0.7–3.1)
Lymphs: 28 %
MCH: 30.3 pg (ref 26.6–33.0)
MCHC: 35 g/dL (ref 31.5–35.7)
MCV: 87 fL (ref 79–97)
Monocytes Absolute: 0.5 10*3/uL (ref 0.1–0.9)
Monocytes: 8 %
Neutrophils Absolute: 3.7 10*3/uL (ref 1.4–7.0)
Neutrophils: 62 %
Platelets: 251 10*3/uL (ref 150–450)
RBC: 4.19 x10E6/uL (ref 3.77–5.28)
RDW: 12.3 % (ref 11.7–15.4)
WBC: 5.9 10*3/uL (ref 3.4–10.8)

## 2020-11-13 LAB — TSH: TSH: 1.32 u[IU]/mL (ref 0.450–4.500)

## 2020-11-13 LAB — COMPREHENSIVE METABOLIC PANEL
ALT: 10 IU/L (ref 0–32)
AST: 14 IU/L (ref 0–40)
Albumin/Globulin Ratio: 1.6 (ref 1.2–2.2)
Albumin: 4.1 g/dL (ref 3.8–4.9)
Alkaline Phosphatase: 69 IU/L (ref 44–121)
BUN/Creatinine Ratio: 14 (ref 9–23)
BUN: 13 mg/dL (ref 6–24)
Bilirubin Total: 0.8 mg/dL (ref 0.0–1.2)
CO2: 22 mmol/L (ref 20–29)
Calcium: 9.2 mg/dL (ref 8.7–10.2)
Chloride: 102 mmol/L (ref 96–106)
Creatinine, Ser: 0.95 mg/dL (ref 0.57–1.00)
GFR calc Af Amer: 80 mL/min/{1.73_m2} (ref >59)
GFR calc non Af Amer: 69 mL/min/{1.73_m2} (ref >59)
Globulin, Total: 2.6 g/dL (ref 1.5–4.5)
Glucose: 81 mg/dL (ref 65–99)
Potassium: 4.2 mmol/L (ref 3.5–5.2)
Sodium: 138 mmol/L (ref 134–144)
Total Protein: 6.7 g/dL (ref 6.0–8.5)

## 2020-11-13 LAB — LIPID PANEL W/O CHOL/HDL RATIO
Cholesterol, Total: 239 mg/dL — ABNORMAL HIGH (ref 100–199)
HDL: 46 mg/dL (ref >39)
LDL Chol Calc (NIH): 155 mg/dL — ABNORMAL HIGH (ref 0–99)
Triglycerides: 209 mg/dL — ABNORMAL HIGH (ref 0–149)
VLDL Cholesterol Cal: 38 mg/dL (ref 5–40)

## 2020-11-13 LAB — HEPATITIS C ANTIBODY: Hep C Virus Ab: <0.1 s/co ratio (ref 0.0–0.9)

## 2020-11-15 ENCOUNTER — Other Ambulatory Visit: Payer: Self-pay | Admitting: Family Medicine

## 2020-11-15 MED ORDER — NITROFURANTOIN MONOHYD MACRO 100 MG PO CAPS: 100.0000 mg | ORAL_CAPSULE | Freq: Two times a day (BID) | ORAL | 0 refills | Status: DC

## 2020-11-16 LAB — URINE CULTURE

## 2020-12-25 ENCOUNTER — Other Ambulatory Visit: Payer: Self-pay

## 2020-12-25 ENCOUNTER — Encounter: Payer: Self-pay | Admitting: Family Medicine

## 2020-12-25 ENCOUNTER — Telehealth (INDEPENDENT_AMBULATORY_CARE_PROVIDER_SITE_OTHER): Payer: BC Managed Care – PPO | Admitting: Family Medicine

## 2020-12-25 DIAGNOSIS — Z20822 Contact with and (suspected) exposure to covid-19: Secondary | ICD-10-CM

## 2020-12-25 MED ORDER — PREDNISONE 50 MG PO TABS: 50.0000 mg | ORAL_TABLET | Freq: Every day | ORAL | 0 refills | Status: DC

## 2020-12-25 MED ORDER — LIDOCAINE VISCOUS HCL 2 % MT SOLN: 15.0000 mL | OROMUCOSAL | 1 refills | Status: DC | PRN

## 2020-12-25 NOTE — Progress Notes (Signed)
There were no vitals taken for this visit.   Subjective:    Patient ID: Julie Patel, female    DOB: 04-02-1966, 55 y.o.   MRN: [id number]  HPI: Julie Patel is a 55 y.o. female  Chief Complaint  Patient presents with  . Sore Throat   UPPER RESPIRATORY TRACT INFECTION- went to be swabbed yesterday Duration: 1 day Worst symptom: sore throat Fever: yes Cough: no Shortness of breath: no Wheezing: no Chest pain: no Chest tightness: no Chest congestion: no Nasal congestion: no Runny nose: no Post nasal drip: yes Sneezing: no Sore throat: yes Swollen glands: no Sinus pressure: no Headache: no Face pain: no Toothache: no Ear pain: no  Ear pressure: no  Eyes red/itching:no Eye drainage/crusting: no  Vomiting: no Rash: no Fatigue: yes Sick contacts: yes Strep contacts: no  Context: stable Recurrent sinusitis: no Relief with OTC cold/cough medications: no  Treatments attempted: none   Relevant past medical, surgical, family and social history reviewed and updated as indicated. Interim medical history since our last visit reviewed. Allergies and medications reviewed and updated.  Review of Systems  Constitutional: Positive for chills and fever. Negative for activity change, appetite change, diaphoresis, fatigue and unexpected weight change.  HENT: Positive for congestion and sore throat. Negative for dental problem, drooling, ear discharge, ear pain, facial swelling, hearing loss, mouth sores, nosebleeds, postnasal drip, rhinorrhea, sinus pressure, sinus pain, sneezing, tinnitus, trouble swallowing and voice change.   Eyes: Negative.   Respiratory: Negative.   Cardiovascular: Negative.   Gastrointestinal: Negative.   Musculoskeletal: Negative.   Psychiatric/Behavioral: Negative.     Per HPI unless specifically indicated above     Objective:    There were no vitals taken for this visit.  Wt Readings from Last 3 Encounters:  11/12/20 165 lb (74.8  kg)  01/02/20 176 lb (79.8 kg)  11/07/19 177 lb 6 oz (80.5 kg)    Physical Exam Vitals and nursing note reviewed.  Constitutional:      General: She is not in acute distress.    Appearance: Normal appearance. She is not ill-appearing, toxic-appearing or diaphoretic.  HENT:     Head: Normocephalic and atraumatic.     Right Ear: External ear normal.     Left Ear: External ear normal.     Nose: Nose normal.     Mouth/Throat:     Mouth: Mucous membranes are moist.     Pharynx: Oropharynx is clear.  Eyes:     General: No scleral icterus.       Right eye: No discharge.        Left eye: No discharge.     Conjunctiva/sclera: Conjunctivae normal.     Pupils: Pupils are equal, round, and reactive to light.  Pulmonary:     Effort: Pulmonary effort is normal. No respiratory distress.     Comments: Speaking in full sentences Musculoskeletal:        General: Normal range of motion.     Cervical back: Normal range of motion.  Skin:    Coloration: Skin is not jaundiced or pale.     Findings: No bruising, erythema, lesion or rash.  Neurological:     Mental Status: She is alert and oriented to person, place, and time. Mental status is at baseline.  Psychiatric:        Mood and Affect: Mood normal.        Behavior: Behavior normal.        Thought Content: Thought content  normal.        Judgment: Judgment normal.     Results for orders placed or performed in visit on 11/12/20  Urine Culture   Specimen: Urine   UR  Result Value Ref Range   Urine Culture, Routine Final report (A)    Organism ID, Bacteria Escherichia coli (A)    ORGANISM ID, BACTERIA Comment    Antimicrobial Susceptibility Comment   Microscopic Examination   Urine  Result Value Ref Range   WBC, UA 0-5 0 - 5 /hpf   RBC None seen 0 - 2 /hpf   Epithelial Cells (non renal) 0-10 0 - 10 /hpf   Bacteria, UA None seen None seen/Few  CBC with Differential/Platelet  Result Value Ref Range   WBC 5.9 3.4 - 10.8 x10E3/uL    RBC 4.19 3.77 - 5.28 x10E6/uL   Hemoglobin 12.7 11.1 - 15.9 g/dL   Hematocrit 36.3 34.0 - 46.6 %   MCV 87 79 - 97 fL   MCH 30.3 26.6 - 33.0 pg   MCHC 35.0 31.5 - 35.7 g/dL   RDW 12.3 11.7 - 15.4 %   Platelets 251 150 - 450 x10E3/uL   Neutrophils 62 Not Estab. %   Lymphs 28 Not Estab. %   Monocytes 8 Not Estab. %   Eos 1 Not Estab. %   Basos 1 Not Estab. %   Neutrophils Absolute 3.7 1.4 - 7.0 x10E3/uL   Lymphocytes Absolute 1.6 0.7 - 3.1 x10E3/uL   Monocytes Absolute 0.5 0.1 - 0.9 x10E3/uL   EOS (ABSOLUTE) 0.1 0.0 - 0.4 x10E3/uL   Basophils Absolute 0.0 0.0 - 0.2 x10E3/uL   Immature Granulocytes 0 Not Estab. %   Immature Grans (Abs) 0.0 0.0 - 0.1 x10E3/uL  Comprehensive metabolic panel  Result Value Ref Range   Glucose 81 65 - 99 mg/dL   BUN 13 6 - 24 mg/dL   Creatinine, Ser 0.95 0.57 - 1.00 mg/dL   GFR calc non Af Amer 69 >59 mL/min/1.73   GFR calc Af Amer 80 >59 mL/min/1.73   BUN/Creatinine Ratio 14 9 - 23   Sodium 138 134 - 144 mmol/L   Potassium 4.2 3.5 - 5.2 mmol/L   Chloride 102 96 - 106 mmol/L   CO2 22 20 - 29 mmol/L   Calcium 9.2 8.7 - 10.2 mg/dL   Total Protein 6.7 6.0 - 8.5 g/dL   Albumin 4.1 3.8 - 4.9 g/dL   Globulin, Total 2.6 1.5 - 4.5 g/dL   Albumin/Globulin Ratio 1.6 1.2 - 2.2   Bilirubin Total 0.8 0.0 - 1.2 mg/dL   Alkaline Phosphatase 69 44 - 121 IU/L   AST 14 0 - 40 IU/L   ALT 10 0 - 32 IU/L  Lipid Panel w/o Chol/HDL Ratio  Result Value Ref Range   Cholesterol, Total 239 (H) 100 - 199 mg/dL   Triglycerides 209 (H) 0 - 149 mg/dL   HDL 46 >39 mg/dL   VLDL Cholesterol Cal 38 5 - 40 mg/dL   LDL Chol Calc (NIH) 155 (H) 0 - 99 mg/dL  TSH  Result Value Ref Range   TSH 1.320 0.450 - 4.500 uIU/mL  Urinalysis, Routine w reflex microscopic  Result Value Ref Range   Specific Gravity, UA 1.025 1.005 - 1.030   pH, UA 7.0 5.0 - 7.5   Color, UA Yellow Yellow   Appearance Ur Clear Clear   Leukocytes,UA Trace (A) Negative   Protein,UA Negative Negative/Trace    Glucose, UA Negative Negative  Ketones, UA Negative Negative   RBC, UA Negative Negative   Bilirubin, UA Negative Negative   Urobilinogen, Ur 0.2 0.2 - 1.0 mg/dL   Nitrite, UA Negative Negative   Microscopic Examination See below:   Hepatitis C Antibody  Result Value Ref Range   Hep C Virus Ab <0.1 0.0 - 0.9 s/co ratio      Assessment & Plan:   Problem List Items Addressed This Visit   None   Visit Diagnoses    Suspected COVID-19 virus infection    -  Primary   Swebbed at health department yesterday. Self-quarantine until results are back. Will treat wiht prednisone and viscous lidocaine. Call if not getting better.       Follow up plan: Return if symptoms worsen or fail to improve.   . This visit was completed via MyChart due to the restrictions of the COVID-19 pandemic. All issues as above were discussed and addressed. Physical exam was done as above through visual confirmation on MyChart. If it was felt that the patient should be evaluated in the office, they were directed there. The patient verbally consented to this visit. . Location of the patient: home . Location of the provider: home . Those involved with this call:  . Provider: Park Liter, DO . CMA: Yvonna Alanis, [place] . Front Desk/Registration: Jill Side  . Time spent on call: 15 minutes with patient face to face via video conference. More than 50% of this time was spent in counseling and coordination of care. 23 minutes total spent in review of patient's record and preparation of their chart.

## 2021-01-10 ENCOUNTER — Ambulatory Visit: Payer: BC Managed Care – PPO | Admitting: Nurse Practitioner

## 2021-01-10 ENCOUNTER — Ambulatory Visit: Payer: Self-pay | Admitting: *Deleted

## 2021-01-10 NOTE — Telephone Encounter (Signed)
Noted  

## 2021-01-10 NOTE — Telephone Encounter (Signed)
Patient calling stating that she experienced vaginal bleeding x 2-3 weeks and feels foggy and like her heart is beating fast. Patient at work at the time of call. 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 Patient denies any dizziness, chest pain, SOB, abdominal pain or other symptoms at this time. Patient states that she had an ablation done years ago and has experienced an episode of bleeding like this maybe one time since ablation. Patient reports that she is changing her pad maybe once an hour and is unsure of the color but states it may be pinkish. Patient offered appt for Monday with Dr. Wynetta Emery, but she wanted to see if it was possible to be seen for an appt today with another provider. 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 Patient states she can be contacted at [phone] if appointments are available today at 3:45 pm. Patient states that a message can be left is she is unable to answer the phone. Patient also advised to seek treatment at Urgent/ED if symptoms become worse before. Understanding verbalized.  Reason for Disposition . Periods last > 7 days  Answer Assessment - Initial Assessment Questions 1. AMOUNT: "Describe the bleeding that you are having."    - SPOTTING: spotting, or pinkish / brownish mucous discharge; does not fill panti-liner or pad    - MILD:  less than 1 pad / hour; less than patient's usual menstrual bleeding   - MODERATE: 1-2 pads / hour; 1 menstrual cup every 6 hours; small-medium blood clots (e.g., pea, grape, small coin)   - SEVERE: soaking 2 or more pads/hour for 2 or more hours; 1 menstrual cup every 2 hours; bleeding not contained by pads or continuous red blood from vagina; large blood clots (e.g., golf ball, large coin)      Sort of like normal range about one an hour, patient unable to describe the color  states that she does not know, maybe pink 2. ONSET: "When did the bleeding begin?" "Is it continuing now?"     2-3 weeks ago 3. MENSTRUAL PERIOD: "When was the last normal menstrual period?" "How is this different than your period?"     Had an ablation done years ago 4. REGULARITY: "How regular are your periods?"     Hx of ablation 5. ABDOMINAL PAIN: "Do you have any pain?" "How bad is the pain?"  (e.g., Scale 1-10; mild, moderate, or severe)   - MILD (1-3): doesn't interfere with normal activities, abdomen soft and not tender to touch    - MODERATE (4-7): interferes with normal activities or awakens from sleep, tender to touch    - SEVERE (8-10): excruciating pain, doubled over, unable to do any normal activities      *No Answer* 6. PREGNANCY: "Could you be pregnant?" "Are you sexually active?" "Did you recently give birth?"     *No Answer* 7. BREASTFEEDING: "Are you breastfeeding?"     *No Answer* 8. HORMONES: "Are you taking any hormone medications, prescription or OTC?" (e.g., birth control pills, estrogen) no  9. BLOOD THINNERS: "Do you take any blood thinners?" (e.g., Coumadin/warfarin, Pradaxa/dabigatran, aspirin)    no 10. CAUSE: "What do you think is causing the bleeding?" (e.g., recent gyn surgery, recent gyn procedure; known bleeding disorder, cervical cancer, polycystic ovarian disease, fibroids)   11. HEMODYNAMIC STATUS: "  Are you weak or feeling lightheaded?" If Yes, ask: "Can you stand and walk normally?"       No denies symptoms but states that she feels foggy headed at times 12. OTHER SYMPTOMS: "What other symptoms are you having with the bleeding?" (e.g., passed tissue, vaginal discharge, fever, menstrual-type cramps)       Denies other symptoms  Protocols used: VAGINAL BLEEDING - ABNORMAL-A-AH

## 2021-01-10 NOTE — Telephone Encounter (Signed)
Pt scheduled for 01/10/21 3:20 with Marnee Guarneri NP

## 2021-04-23 ENCOUNTER — Ambulatory Visit: Payer: BC Managed Care – PPO | Admitting: Dermatology

## 2021-05-08 ENCOUNTER — Ambulatory Visit: Payer: BC Managed Care – PPO | Admitting: Family Medicine

## 2021-05-08 NOTE — Progress Notes (Deleted)
There were no vitals taken for this visit.   Subjective:    Patient ID: Julie Patel, female    DOB: 05/07/1966, 55 y.o.   MRN: [id number]  HPI: Julie Patel is a 55 y.o. female  No chief complaint on file.  URINARY SYMPTOMS  Dysuria: {Blank single:19197::"yes","no","burning"} Urinary frequency: {Blank single:19197::"yes","no"} Urgency: {Blank single:19197::"yes","no"} Small volume voids: {Blank single:19197::"yes","no"} Symptom severity: {Blank single:19197::"yes","no"} Urinary incontinence: {Blank single:19197::"yes","no"} Foul odor: {Blank single:19197::"yes","no"} Hematuria: {Blank single:19197::"yes","no"} Abdominal pain: {Blank single:19197::"yes","no"} Back pain: {Blank single:19197::"yes","no"} Suprapubic pain/pressure: {Blank single:19197::"yes","no"} Flank pain: {Blank single:19197::"yes","no"} Fever:  {Blank multiple:19196::"yes","no","subjective","low grade"} Vomiting: {Blank single:19197::"yes","no"} Relief with cranberry juice: {Blank single:19197::"yes","no"} Relief with pyridium: {Blank single:19197::"yes","no"} Status: better/worse/stable Previous urinary tract infection: {Blank single:19197::"yes","no"} Recurrent urinary tract infection: {Blank single:19197::"yes","no"} Sexual activity: No sexually active/monogomous/practicing safe sex History of sexually transmitted disease: {Blank single:19197::"yes","no"} Penile discharge: {Blank single:19197::"yes","no"} Treatments attempted: {Blank multiple:19196::"none","antibiotics","pyridium","cranberry","increasing fluids"}    Relevant past medical, surgical, family and social history reviewed and updated as indicated. Interim medical history since our last visit reviewed. Allergies and medications reviewed and updated.  Review of Systems  Per HPI unless specifically indicated above     Objective:    There were no vitals taken for this visit.  Wt Readings from Last 3 Encounters:  11/12/20 165  lb (74.8 kg)  01/02/20 176 lb (79.8 kg)  11/07/19 177 lb 6 oz (80.5 kg)    Physical Exam  Results for orders placed or performed in visit on 11/12/20  Urine Culture   Specimen: Urine   UR  Result Value Ref Range   Urine Culture, Routine Final report (A)    Organism ID, Bacteria Escherichia coli (A)    ORGANISM ID, BACTERIA Comment    Antimicrobial Susceptibility Comment   Microscopic Examination   Urine  Result Value Ref Range   WBC, UA 0-5 0 - 5 /hpf   RBC None seen 0 - 2 /hpf   Epithelial Cells (non renal) 0-10 0 - 10 /hpf   Bacteria, UA None seen None seen/Few  CBC with Differential/Platelet  Result Value Ref Range   WBC 5.9 3.4 - 10.8 x10E3/uL   RBC 4.19 3.77 - 5.28 x10E6/uL   Hemoglobin 12.7 11.1 - 15.9 g/dL   Hematocrit 36.3 34.0 - 46.6 %   MCV 87 79 - 97 fL   MCH 30.3 26.6 - 33.0 pg   MCHC 35.0 31.5 - 35.7 g/dL   RDW 12.3 11.7 - 15.4 %   Platelets 251 150 - 450 x10E3/uL   Neutrophils 62 Not Estab. %   Lymphs 28 Not Estab. %   Monocytes 8 Not Estab. %   Eos 1 Not Estab. %   Basos 1 Not Estab. %   Neutrophils Absolute 3.7 1.4 - 7.0 x10E3/uL   Lymphocytes Absolute 1.6 0.7 - 3.1 x10E3/uL   Monocytes Absolute 0.5 0.1 - 0.9 x10E3/uL   EOS (ABSOLUTE) 0.1 0.0 - 0.4 x10E3/uL   Basophils Absolute 0.0 0.0 - 0.2 x10E3/uL   Immature Granulocytes 0 Not Estab. %   Immature Grans (Abs) 0.0 0.0 - 0.1 x10E3/uL  Comprehensive metabolic panel  Result Value Ref Range   Glucose 81 65 - 99 mg/dL   BUN 13 6 - 24 mg/dL   Creatinine, Ser 0.95 0.57 - 1.00 mg/dL   GFR calc non Af Amer 69 >59 mL/min/1.73   GFR calc Af Amer 80 >59 mL/min/1.73   BUN/Creatinine Ratio 14 9 - 23   Sodium 138 134 - 144 mmol/L   Potassium  4.2 3.5 - 5.2 mmol/L   Chloride 102 96 - 106 mmol/L   CO2 22 20 - 29 mmol/L   Calcium 9.2 8.7 - 10.2 mg/dL   Total Protein 6.7 6.0 - 8.5 g/dL   Albumin 4.1 3.8 - 4.9 g/dL   Globulin, Total 2.6 1.5 - 4.5 g/dL   Albumin/Globulin Ratio 1.6 1.2 - 2.2   Bilirubin Total  0.8 0.0 - 1.2 mg/dL   Alkaline Phosphatase 69 44 - 121 IU/L   AST 14 0 - 40 IU/L   ALT 10 0 - 32 IU/L  Lipid Panel w/o Chol/HDL Ratio  Result Value Ref Range   Cholesterol, Total 239 (H) 100 - 199 mg/dL   Triglycerides 209 (H) 0 - 149 mg/dL   HDL 46 >39 mg/dL   VLDL Cholesterol Cal 38 5 - 40 mg/dL   LDL Chol Calc (NIH) 155 (H) 0 - 99 mg/dL  TSH  Result Value Ref Range   TSH 1.320 0.450 - 4.500 uIU/mL  Urinalysis, Routine w reflex microscopic  Result Value Ref Range   Specific Gravity, UA 1.025 1.005 - 1.030   pH, UA 7.0 5.0 - 7.5   Color, UA Yellow Yellow   Appearance Ur Clear Clear   Leukocytes,UA Trace (A) Negative   Protein,UA Negative Negative/Trace   Glucose, UA Negative Negative   Ketones, UA Negative Negative   RBC, UA Negative Negative   Bilirubin, UA Negative Negative   Urobilinogen, Ur 0.2 0.2 - 1.0 mg/dL   Nitrite, UA Negative Negative   Microscopic Examination See below:   Hepatitis C Antibody  Result Value Ref Range   Hep C Virus Ab <0.1 0.0 - 0.9 s/co ratio      Assessment & Plan:   Problem List Items Addressed This Visit   None   Visit Diagnoses    Dysuria    -  Primary       Follow up plan: No follow-ups on file.

## 2021-05-19 ENCOUNTER — Ambulatory Visit: Payer: BC Managed Care – PPO | Admitting: Nurse Practitioner

## 2021-05-19 ENCOUNTER — Other Ambulatory Visit: Payer: Self-pay

## 2021-05-19 ENCOUNTER — Encounter: Payer: Self-pay | Admitting: Nurse Practitioner

## 2021-05-19 VITALS — BP 149/84 | HR 91 | Temp 99.0°F | Ht 64.0 in | Wt 174.4 lb

## 2021-05-19 DIAGNOSIS — S46911A Strain of unspecified muscle, fascia and tendon at shoulder and upper arm level, right arm, initial encounter: Secondary | ICD-10-CM

## 2021-05-19 MED ORDER — MELOXICAM 7.5 MG PO TABS: 7.5000 mg | ORAL_TABLET | Freq: Every day | ORAL | 1 refills | Status: DC

## 2021-05-19 NOTE — Progress Notes (Signed)
Acute Office Visit  Subjective:    Patient ID: Julie Patel, female    DOB: Feb 18, 1966, 55 y.o.   MRN: [id number]  Chief Complaint  Patient presents with   Arm Pain    Pt states she has been having pain in her R arm that has gradually been getting worse. States it started in her elbow and has now moved to her shoulder.     HPI Patient is in today for right shoulder pain.   Started in right elbow several months ago. 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 She started wearing a  tennis elbow brace which helped. She is now out of work for the summer and she started with right shoulder pain for 1 week.   SHOULDER PAIN  Duration: weeks Involved shoulder: right Mechanism of injury: unknown Location: lateral Onset:gradual Severity: moderate  Quality:  sharp and aching Frequency: constant Radiation: no Aggravating factors: lifting and movement  Alleviating factors: nothing  Status: worse Treatments attempted: heat, APAP, and ibuprofen  Relief with NSAIDs?:  no Weakness: no Numbness: no Decreased grip strength: no Redness: no Swelling: no Bruising: no Fevers: no   Past Medical History:  Diagnosis Date   Abnormal uterine bleeding    Anemia    GERD (gastroesophageal reflux disease)    occ   Neck pain     Past Surgical History:  Procedure Laterality Date   DILITATION & CURRETTAGE/HYSTROSCOPY WITH NOVASURE ABLATION N/A 03/08/2017   Procedure: DILATATION & CURETTAGE/HYSTEROSCOPY WITH NOVASURE ABLATION;  Surgeon: Brayton Mars, MD;  Location: [facility];  Service: Gynecology;  Laterality: N/A;   WISDOM TOOTH EXTRACTION      Family History  Problem Relation Age of Onset   Alzheimer's disease Mother    Cancer Father        Prostate   Hypertension Brother    Alzheimer's disease Maternal Grandmother    Cancer Maternal Grandfather        Lung   Diabetes Paternal Grandmother    Stroke Paternal Grandfather    Breast cancer  Neg Hx    Ovarian cancer Neg Hx    Colon cancer Neg Hx     Social History   Socioeconomic History   Marital status: Married    Spouse name: Not on file   Number of children: Not on file   Years of education: Not on file   Highest education level: Not on file  Occupational History   Not on file  Tobacco Use   Smoking status: Never   Smokeless tobacco: Never  Vaping Use   Vaping Use: Never used  Substance and Sexual Activity   Alcohol use: No   Drug use: No   Sexual activity: Not Currently  Other Topics Concern   Not on file  Social History Narrative   Not on file   Social Determinants of Health   Financial Resource Strain: Not on file  Food Insecurity: Not on file  Transportation Needs: Not on file  Physical Activity: Not on file  Stress: Not on file  Social Connections: Not on file  Intimate Partner Violence: Not on file    Outpatient Medications Prior to Visit  Medication Sig Dispense Refill   ibuprofen (ADVIL,MOTRIN) 800 MG tablet Take 1 tablet (800 mg total) by mouth 3 (three) times daily. (Patient not taking: No sig reported) 30 tablet 1   lidocaine (XYLOCAINE) 2 % solution Use as directed 15 mLs in the mouth  or throat every 4 (four) hours as needed for mouth pain. 100 mL 1   predniSONE (DELTASONE) 50 MG tablet Take 1 tablet (50 mg total) by mouth daily with breakfast. 5 tablet 0   No facility-administered medications prior to visit.    Allergies  Allergen Reactions   Codeine Nausea And Vomiting    Review of Systems  Constitutional: Negative.   Respiratory: Negative.    Cardiovascular: Negative.   Gastrointestinal: Negative.   Genitourinary: Negative.   Musculoskeletal:  Positive for arthralgias (right shoulder pain).  Skin: Negative.   Neurological: Negative.       Objective:    Physical Exam Vitals and nursing note reviewed.  Constitutional:      General: She is not in acute distress.    Appearance: Normal appearance.  HENT:     Head:  Normocephalic.  Eyes:     Conjunctiva/sclera: Conjunctivae normal.  Cardiovascular:     Rate and Rhythm: Normal rate and regular rhythm.     Pulses: Normal pulses.     Heart sounds: Normal heart sounds.  Pulmonary:     Effort: Pulmonary effort is normal.     Breath sounds: Normal breath sounds.  Musculoskeletal:        General: No tenderness.     Cervical back: Normal range of motion.     Comments: Limited ROM to right shoulder due to pain.   Skin:    General: Skin is warm.  Neurological:     General: No focal deficit present.     Mental Status: She is alert and oriented to person, place, and time.  Psychiatric:        Mood and Affect: Mood normal.        Behavior: Behavior normal.        Thought Content: Thought content normal.        Judgment: Judgment normal.    BP (!) 149/84   Pulse 91   Temp 99 F (37.2 C) (Oral)   Ht 5\' 4"  (1.626 m)   Wt 174 lb 6.4 oz (79.1 kg)   SpO2 99%   BMI 29.94 kg/m  Wt Readings from Last 3 Encounters:  05/19/21 174 lb 6.4 oz (79.1 kg)  11/12/20 165 lb (74.8 kg)  01/02/20 176 lb (79.8 kg)    Health Maintenance Due  Topic Date Due   COLONOSCOPY (Pts 45-46yrs Insurance coverage will need to be confirmed)  Never done   MAMMOGRAM  Never done   Zoster Vaccines- Shingrix (1 of 2) Never done    There are no preventive care reminders to display for this patient.   Lab Results  Component Value Date   TSH 1.320 11/12/2020   Lab Results  Component Value Date   WBC 5.9 11/12/2020   HGB 12.7 11/12/2020   HCT 36.3 11/12/2020   MCV 87 11/12/2020   PLT 251 11/12/2020   Lab Results  Component Value Date   NA 138 11/12/2020   K 4.2 11/12/2020   CO2 22 11/12/2020   GLUCOSE 81 11/12/2020   BUN 13 11/12/2020   CREATININE 0.95 11/12/2020   BILITOT 0.8 11/12/2020   ALKPHOS 69 11/12/2020   AST 14 11/12/2020   ALT 10 11/12/2020   PROT 6.7 11/12/2020   ALBUMIN 4.1 11/12/2020   CALCIUM 9.2 11/12/2020   Lab Results  Component Value Date    CHOL 239 (H) 11/12/2020   Lab Results  Component Value Date   HDL 46 11/12/2020   Lab Results  Component  Value Date   LDLCALC 155 (H) 11/12/2020   Lab Results  Component Value Date   TRIG 209 (H) 11/12/2020   No results found for: CHOLHDL No results found for: HGBA1C     Assessment & Plan:   Problem List Items Addressed This Visit       Musculoskeletal and Integument   Strain of right shoulder - Primary    Will treat with ice/heat, stretches, and mobic daily. Exercises printed and given to patient. Follow-up in 6 weeks or sooner with any concerns. Consider imaging at next visit if symptoms aren't improving.          Meds ordered this encounter  Medications   meloxicam (MOBIC) 7.5 MG tablet    Sig: Take 1 tablet (7.5 mg total) by mouth daily.    Dispense:  30 tablet    Refill:  1      Charyl Dancer, NP

## 2021-05-19 NOTE — Patient Instructions (Signed)
It was great to see you!  You can start mobic daily with food for pain and inflammation. I printed some exercises to complete daily. You can use heat or ice on your shoulder as well.   Let's follow-up in 6 weeks, sooner if you have concerns.  If a referral was placed today, you will be contacted for an appointment. Please note that routine referrals can sometimes take up to 3-4 weeks to process. Please call our office if you haven't heard anything after this time frame.  Take care,  Vance Peper, NP

## 2021-05-19 NOTE — Assessment & Plan Note (Addendum)
Will treat with ice/heat, stretches, and mobic daily. Exercises printed and given to patient. Follow-up in 6 weeks or sooner with any concerns. Consider imaging at next visit if symptoms aren't improving.

## 2021-06-26 ENCOUNTER — Encounter: Payer: Self-pay | Admitting: Family Medicine

## 2021-06-26 ENCOUNTER — Ambulatory Visit: Payer: BC Managed Care – PPO | Admitting: Family Medicine

## 2021-06-26 ENCOUNTER — Other Ambulatory Visit: Payer: Self-pay

## 2021-06-26 VITALS — BP 137/76 | HR 75 | Temp 98.3°F | Ht 64.0 in | Wt 177.2 lb

## 2021-06-26 DIAGNOSIS — N939 Abnormal uterine and vaginal bleeding, unspecified: Secondary | ICD-10-CM | POA: Diagnosis not present

## 2021-06-26 DIAGNOSIS — N898 Other specified noninflammatory disorders of vagina: Secondary | ICD-10-CM | POA: Diagnosis not present

## 2021-06-26 DIAGNOSIS — N393 Stress incontinence (female) (male): Secondary | ICD-10-CM | POA: Diagnosis not present

## 2021-06-26 DIAGNOSIS — N760 Acute vaginitis: Secondary | ICD-10-CM | POA: Diagnosis not present

## 2021-06-26 DIAGNOSIS — B9689 Other specified bacterial agents as the cause of diseases classified elsewhere: Secondary | ICD-10-CM

## 2021-06-26 LAB — WET PREP FOR TRICH, YEAST, CLUE
Clue Cell Exam: POSITIVE — AB
Trichomonas Exam: NEGATIVE
Yeast Exam: NEGATIVE

## 2021-06-26 MED ORDER — METRONIDAZOLE 500 MG PO TABS: 500.0000 mg | ORAL_TABLET | Freq: Two times a day (BID) | ORAL | 0 refills | Status: DC

## 2021-06-26 NOTE — Progress Notes (Signed)
BP 137/76   Pulse 75   Temp 98.3 F (36.8 C) (Oral)   Ht 5\' 4"  (1.626 m)   Wt 177 lb 3.2 oz (80.4 kg)   SpO2 98%   BMI 30.42 kg/m    Subjective:    Patient ID: Julie Patel, female    DOB: 29-Dec-1965, 55 y.o.   MRN: [id number]  HPI: Julie Patel is a 55 y.o. female  Chief Complaint  Patient presents with   Menopause    Pt states she has been having issues with menopause lately. States she has started to have some more bleeding since the beginning of the summer. States she has also noticed a odor in the last week or so as well.    ABNORMAL MENSTRUAL PERIODS- had an ablation about 4 years ago. Had no periods for a couple of years, then the periods started back up again. She has had some more bad cramps and increased bleeding. She notes that she has been having more steady bleeding for the past couple of months.  [id number] Duration: 1.5 months Average interval between menses: consistent spotting for about 1.5 months Length of menses: constant Flow: light Dysmenorrhea: yes Intermenstrual bleeding:yes Postcoital bleeding: no Contraception: ablation Sexual activity: In a Monogamous Relationship History of sexually transmitted diseases: no History GYN procedures: yes Abnormal pap smears: no   Dyspareunia: no Vaginal discharge:no Abdominal pain: no Galactorrhea: no Hirsuitism: no Frequent bruising/mucosal bleeding: no Double vision:no Hot flashes: no  Relevant past medical, surgical, family and social history reviewed and updated as indicated. Interim medical history since our last visit reviewed. Allergies and medications reviewed and updated.  Review of Systems  Constitutional: Negative.   Respiratory: Negative.    Cardiovascular: Negative.   Gastrointestinal: Negative.   Genitourinary:  Positive for menstrual problem and vaginal bleeding. Negative for decreased urine volume, difficulty urinating, dyspareunia, dysuria, enuresis, flank pain, frequency,  genital sores, hematuria, pelvic pain, urgency, vaginal discharge and vaginal pain.  Psychiatric/Behavioral: Negative.     Per HPI unless specifically indicated above     Objective:    BP 137/76   Pulse 75   Temp 98.3 F (36.8 C) (Oral)   Ht 5\' 4"  (1.626 m)   Wt 177 lb 3.2 oz (80.4 kg)   SpO2 98%   BMI 30.42 kg/m   Wt Readings from Last 3 Encounters:  06/26/21 177 lb 3.2 oz (80.4 kg)  05/19/21 174 lb 6.4 oz (79.1 kg)  11/12/20 165 lb (74.8 kg)    Physical Exam Vitals and nursing note reviewed.  Constitutional:      General: She is not in acute distress.    Appearance: Normal appearance. She is not ill-appearing, toxic-appearing or diaphoretic.  HENT:     Head: Normocephalic and atraumatic.     Right Ear: External ear normal.     Left Ear: External ear normal.     Nose: Nose normal.     Mouth/Throat:     Mouth: Mucous membranes are moist.     Pharynx: Oropharynx is clear.  Eyes:     General: No scleral icterus.       Right eye: No discharge.        Left eye: No discharge.     Extraocular Movements: Extraocular movements intact.     Conjunctiva/sclera: Conjunctivae normal.     Pupils: Pupils are equal, round, and reactive to light.  Cardiovascular:     Rate and Rhythm: Normal rate and regular rhythm.     Pulses:  Normal pulses.     Heart sounds: Normal heart sounds. No murmur heard.   No friction rub. No gallop.  Pulmonary:     Effort: Pulmonary effort is normal. No respiratory distress.     Breath sounds: Normal breath sounds. No stridor. No wheezing, rhonchi or rales.  Chest:     Chest wall: No tenderness.  Musculoskeletal:        General: Normal range of motion.     Cervical back: Normal range of motion and neck supple.  Skin:    General: Skin is warm and dry.     Capillary Refill: Capillary refill takes less than 2 seconds.     Coloration: Skin is not jaundiced or pale.     Findings: No bruising, erythema, lesion or rash.  Neurological:     General: No  focal deficit present.     Mental Status: She is alert and oriented to person, place, and time. Mental status is at baseline.  Psychiatric:        Mood and Affect: Mood normal.        Behavior: Behavior normal.        Thought Content: Thought content normal.        Judgment: Judgment normal.    Results for orders placed or performed in visit on 06/26/21  WET PREP FOR [place], YEAST, CLUE   Specimen: Sterile Swab   Sterile Swab  Result Value Ref Range   Trichomonas Exam Negative Negative   Yeast Exam Negative Negative   Clue Cell Exam Positive (A) Negative      Assessment & Plan:   Problem List Items Addressed This Visit   None Visit Diagnoses     Vaginal bleeding    -  Primary   Will recheck [place] and get her into GYN. Call with any concerns. Continue to monitor.    Relevant Orders   US Pelvic Complete With Transvaginal   Ambulatory referral to Obstetrics / Gynecology   BV (bacterial vaginosis)       Will treat with flagyl. Call with any concerns.    Relevant Medications   metroNIDAZOLE (FLAGYL) 500 MG tablet   Stress incontinence       Will get her into GYN for evaluation. Call with any concerns. Continue to monitor.    Relevant Orders   Ambulatory referral to Obstetrics / Gynecology   Vaginal odor       + clue cells.    Relevant Orders   WET PREP FOR [place], [place] (Completed)        Follow up plan: Return if symptoms worsen or fail to improve.

## 2021-06-27 ENCOUNTER — Ambulatory Visit: Payer: BC Managed Care – PPO | Admitting: Family Medicine

## 2021-06-30 ENCOUNTER — Ambulatory Visit: Payer: BC Managed Care – PPO | Admitting: Family Medicine

## 2021-07-04 ENCOUNTER — Ambulatory Visit: Payer: BC Managed Care – PPO | Admitting: Family Medicine

## 2021-07-08 ENCOUNTER — Ambulatory Visit: Payer: BC Managed Care – PPO | Admitting: Family Medicine

## 2021-07-14 ENCOUNTER — Ambulatory Visit: Payer: BC Managed Care – PPO | Attending: Family Medicine

## 2021-07-28 ENCOUNTER — Encounter: Payer: Self-pay | Admitting: Obstetrics and Gynecology

## 2021-07-29 ENCOUNTER — Ambulatory Visit: Payer: BC Managed Care – PPO

## 2021-09-01 ENCOUNTER — Other Ambulatory Visit: Payer: Self-pay

## 2021-09-01 ENCOUNTER — Ambulatory Visit: Payer: BC Managed Care – PPO | Admitting: Dermatology

## 2021-09-01 DIAGNOSIS — L720 Epidermal cyst: Secondary | ICD-10-CM | POA: Diagnosis not present

## 2021-09-01 DIAGNOSIS — L719 Rosacea, unspecified: Secondary | ICD-10-CM | POA: Diagnosis not present

## 2021-09-01 DIAGNOSIS — D235 Other benign neoplasm of skin of trunk: Secondary | ICD-10-CM | POA: Diagnosis not present

## 2021-09-01 DIAGNOSIS — I781 Nevus, non-neoplastic: Secondary | ICD-10-CM

## 2021-09-01 DIAGNOSIS — D239 Other benign neoplasm of skin, unspecified: Secondary | ICD-10-CM

## 2021-09-01 DIAGNOSIS — Z1283 Encounter for screening for malignant neoplasm of skin: Secondary | ICD-10-CM | POA: Diagnosis not present

## 2021-09-01 DIAGNOSIS — D1800 Hemangioma unspecified site: Secondary | ICD-10-CM

## 2021-09-01 DIAGNOSIS — L304 Erythema intertrigo: Secondary | ICD-10-CM

## 2021-09-01 DIAGNOSIS — L821 Other seborrheic keratosis: Secondary | ICD-10-CM

## 2021-09-01 DIAGNOSIS — L578 Other skin changes due to chronic exposure to nonionizing radiation: Secondary | ICD-10-CM

## 2021-09-01 DIAGNOSIS — L814 Other melanin hyperpigmentation: Secondary | ICD-10-CM

## 2021-09-01 DIAGNOSIS — D229 Melanocytic nevi, unspecified: Secondary | ICD-10-CM

## 2021-09-01 MED ORDER — TRETINOIN 0.025 % EX GEL: Freq: Every day | CUTANEOUS | 0 refills | Status: DC

## 2021-09-01 NOTE — Progress Notes (Signed)
New Patient Visit  Subjective  Julie Patel is a 55 y.o. female who presents for the following: Annual Exam (Spot of right nose that comes and goes - Desires TBSE today). The patient presents for Total-Body Skin Exam (TBSE) for skin cancer screening and mole check.  Referred By Park Liter, DO  The patient presents for Total-Body Skin Exam (TBSE) for skin cancer screening and mole check.  The following portions of the chart were reviewed this encounter and updated as appropriate:   Tobacco  Allergies  Meds  Problems  Med Hx  Surg Hx  Fam Hx     Review of Systems:  No other skin or systemic complaints except as noted in HPI or Assessment and Plan.  Objective  Well appearing patient in no apparent distress; mood and affect are within normal limits.  A full examination was performed including scalp, head, eyes, ears, nose, lips, neck, chest, axillae, abdomen, back, buttocks, bilateral upper extremities, bilateral lower extremities, hands, feet, fingers, toes, fingernails, and toenails. All findings within normal limits unless otherwise noted below.  Face Smooth white papule(s).   Face Erythema and dilated blood vessels  Right Upper Back Firm pink/brown papulenodule with dimple sign.   between toes Pinkness   Right Tip of Nose Dilated blood vessel        Assessment & Plan   Lentigines - Scattered tan macules - Due to sun exposure - Benign-appearing, observe - Recommend daily broad spectrum sunscreen SPF 30+ to sun-exposed areas, reapply every 2 hours as needed. - Call for any changes  Seborrheic Keratoses - Stuck-on, waxy, tan-brown papules and/or plaques  - Benign-appearing - Discussed benign etiology and prognosis. - Observe - Call for any changes  Melanocytic Nevi - Tan-brown and/or pink-flesh-colored symmetric macules and papules - Benign appearing on exam today - Observation - Call clinic for new or changing moles - Recommend daily use  of broad spectrum spf 30+ sunscreen to sun-exposed areas.   Hemangiomas - Red papules - Discussed benign nature - Observe - Call for any changes  Actinic Damage - Chronic condition, secondary to cumulative UV/sun exposure - diffuse scaly erythematous macules with underlying dyspigmentation - Recommend daily broad spectrum sunscreen SPF 30+ to sun-exposed areas, reapply every 2 hours as needed.  - Staying in the shade or wearing long sleeves, sun glasses (UVA+UVB protection) and wide brim hats (4-inch brim around the entire circumference of the hat) are also recommended for sun protection.  - Call for new or changing lesions.  Skin cancer screening performed today.  Milia Face Chronic and persistent Start Tretinoin 0.025% qhs tretinoin (RETIN-A) 0.025 % gel - Face Apply topically at bedtime.  Rosacea Face Rosacea is a chronic progressive skin condition usually affecting the face of adults, causing redness and/or acne bumps. It is treatable but not curable. It sometimes affects the eyes (ocular rosacea) as well. It may respond to topical and/or systemic medication and can flare with stress, sun exposure, alcohol, exercise and some foods.  Daily application of broad spectrum spf 30+ sunscreen to face is recommended to reduce flares.  Discussed BBL/laser treatments. Advised patient fee is $350/treatment and she may need up to 3 treatments for best results  Dermatofibroma Right Upper Back Benign  Erythema intertrigo between toes Intertrigo is a chronic recurrent rash that occurs in skin fold areas that may be associated with friction; heat; moisture; yeast; fungus; and bacteria.  It is exacerbated by increased movement / activity; sweating; and higher atmospheric temperature.  Start  [facility] Iodoquinol 1%, Hydrocortisone 2.5%, Niacinamide 2% Cream twice a day to affected areas for up to two weeks.  The patient was advised this is not covered by insurance since it is made by a  compounding pharmacy. They will receive an email to check out and the medication will be mailed to their home.    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 Recheck on follow up. Discussed laser/BBL.  Patient may consider.  Skin cancer screening  Return in about 4 months (around 01/01/2022) for Recheck telangiectasia, Intertrigo follow up.  I, Ashok Cordia, CMA, am acting as scribe for Sarina Ser, MD . Documentation: I have reviewed the above documentation for accuracy and completeness, and I agree with the above.  Sarina Ser, MD

## 2021-09-01 NOTE — Patient Instructions (Addendum)
Instructions for [facility] Medications  One or more of your medications was sent to the [facility] mail order compounding pharmacy. You will receive an email from them and can purchase the medicine through that link. It will then be mailed to your home at the address you confirmed. If for any reason you do not receive an email from them, please check your spam folder. If you still do not find the email, please let us know. [facility] phone number is [phone].   If you have any questions or concerns for your doctor, please call our main line at [phone] and press option 4 to reach your doctor's medical assistant. If no one answers, please leave a voicemail as directed and we will return your call as soon as possible. Messages left after 4 pm will be answered the following business day.   You may also send us a message via MyChart. We typically respond to MyChart messages within 1-2 business days.  For prescription refills, please ask your pharmacy to contact our office. Our fax number is [phone].  If you have an urgent issue when the clinic is closed that cannot wait until the next business day, you can page your doctor at the number below.    Please note that while we do our best to be available for urgent issues outside of office hours, we are not available 24/7.   If you have an urgent issue and are unable to reach us, you may choose to seek medical care at your doctor's office, retail clinic, urgent care center, or emergency room.  If you have a medical emergency, please immediately call 911 or go to the emergency department.  Pager Numbers  - Dr. Kowalski: [phone]  - Dr. Moye: [phone]  - Dr. Stewart: [phone]  In the event of inclement weather, please call our main line at [phone] for an update on the status of any delays or closures.  Dermatology Medication Tips: Please keep the boxes that topical medications come in in order to help  keep track of the instructions about where and how to use these. Pharmacies typically print the medication instructions only on the boxes and not directly on the medication tubes.   If your medication is too expensive, please contact our office at [phone] option 4 or send us a message through MyChart.   We are unable to tell what your co-pay for medications will be in advance as this is different depending on your insurance coverage. However, we may be able to find a substitute medication at lower cost or fill out paperwork to get insurance to cover a needed medication.   If a prior authorization is required to get your medication covered by your insurance company, please allow us 1-2 business days to complete this process.  Drug prices often vary depending on where the prescription is filled and some pharmacies may offer cheaper prices.  The website www.goodrx.com contains coupons for medications through different pharmacies. The prices here do not account for what the cost may be with help from insurance (it may be cheaper with your insurance), but the website can give you the price if you did not use any insurance.  - You can print the associated coupon and take it with your prescription to the pharmacy.  - You may also stop by our office during regular business hours and pick up a GoodRx coupon card.  - If you need your prescription sent electronically to a different pharmacy, notify our office   through [facility] MyChart or by phone at [phone] option 4.  

## 2021-09-04 ENCOUNTER — Encounter: Payer: Self-pay | Admitting: Dermatology

## 2021-11-14 ENCOUNTER — Ambulatory Visit (INDEPENDENT_AMBULATORY_CARE_PROVIDER_SITE_OTHER): Payer: BC Managed Care – PPO | Admitting: Family Medicine

## 2021-11-14 ENCOUNTER — Encounter: Payer: Self-pay | Admitting: Family Medicine

## 2021-11-14 ENCOUNTER — Other Ambulatory Visit: Payer: Self-pay

## 2021-11-14 VITALS — BP 115/80 | HR 62 | Temp 98.1°F | Ht 65.0 in | Wt 179.6 lb

## 2021-11-14 DIAGNOSIS — Z0000 Encounter for general adult medical examination without abnormal findings: Secondary | ICD-10-CM

## 2021-11-14 DIAGNOSIS — Z1231 Encounter for screening mammogram for malignant neoplasm of breast: Secondary | ICD-10-CM | POA: Diagnosis not present

## 2021-11-14 DIAGNOSIS — N951 Menopausal and female climacteric states: Secondary | ICD-10-CM | POA: Diagnosis not present

## 2021-11-14 LAB — URINALYSIS, ROUTINE W REFLEX MICROSCOPIC
Bilirubin, UA: NEGATIVE
Glucose, UA: NEGATIVE
Ketones, UA: NEGATIVE
Nitrite, UA: NEGATIVE
Protein,UA: NEGATIVE
Specific Gravity, UA: 1.015 (ref 1.005–1.030)
Urobilinogen, Ur: 0.2 mg/dL (ref 0.2–1.0)
pH, UA: 5.5 (ref 5.0–7.5)

## 2021-11-14 LAB — MICROSCOPIC EXAMINATION: Bacteria, UA: NONE SEEN

## 2021-11-14 NOTE — Patient Instructions (Signed)
Please call to schedule your mammogram: °[facility]  °Address: [address], [place], [place] [address]  °Phone: [phone] ° °

## 2021-11-14 NOTE — Progress Notes (Signed)
BP 115/80   Pulse 62   Temp 98.1 F (36.7 C)   Ht 5\' 5"  (1.651 m)   Wt 179 lb 9.6 oz (81.5 kg)   SpO2 98%   BMI 29.89 kg/m    Subjective:    Patient ID: Julie Patel, female    DOB: 13-Apr-1966, 55 y.o.   MRN: [id number]  HPI: Julie Patel is a 55 y.o. female presenting on 11/14/2021 for comprehensive medical examination. Current medical complaints include:none  Menopausal Symptoms: yes- not bad  Depression Screen done today and results listed below:  Depression screen [facility] 2/9 11/14/2021 11/12/2020 11/07/2019 12/02/2016  Decreased Interest 0 0 0 0  Down, Depressed, Hopeless 0 0 0 0  PHQ - 2 Score 0 0 0 0  Altered sleeping 0 - 0 -  Tired, decreased energy 0 - 0 -  Change in appetite 0 - 1 -  Feeling bad or failure about yourself  0 - 0 -  Trouble concentrating 0 - 0 -  Moving slowly or fidgety/restless 0 - 0 -  Suicidal thoughts 0 - 0 -  PHQ-9 Score 0 - 1 -  Difficult doing work/chores - - Not difficult at all -    Past Medical History:  Past Medical History:  Diagnosis Date   Abnormal uterine bleeding    Anemia    GERD (gastroesophageal reflux disease)    occ   Neck pain     Surgical History:  Past Surgical History:  Procedure Laterality Date   DILITATION & CURRETTAGE/HYSTROSCOPY WITH NOVASURE ABLATION N/A 03/08/2017   Procedure: DILATATION & CURETTAGE/HYSTEROSCOPY WITH NOVASURE ABLATION;  Surgeon: Brayton Mars, MD;  Location: [facility] ORS;  Service: Gynecology;  Laterality: N/A;   WISDOM TOOTH EXTRACTION      Medications:  No current outpatient medications on file prior to visit.   No current facility-administered medications on file prior to visit.    Allergies:  Allergies  Allergen Reactions   Codeine Nausea And Vomiting    Social History:  Social History   Socioeconomic History   Marital status: Married    Spouse name: Not on file   Number of children: Not on file   Years of education: Not on file   Highest education level: Not  on file  Occupational History   Not on file  Tobacco Use   Smoking status: Never   Smokeless tobacco: Never  Vaping Use   Vaping Use: Never used  Substance and Sexual Activity   Alcohol use: No   Drug use: No   Sexual activity: Not Currently  Other Topics Concern   Not on file  Social History Narrative   Not on file   Social Determinants of Health   Financial Resource Strain: Not on file  Food Insecurity: Not on file  Transportation Needs: Not on file  Physical Activity: Not on file  Stress: Not on file  Social Connections: Not on file  Intimate Partner Violence: Not on file   Social History   Tobacco Use  Smoking Status Never  Smokeless Tobacco Never   Social History   Substance and Sexual Activity  Alcohol Use No    Family History:  Family History  Problem Relation Age of Onset   Alzheimer's disease Mother    Cancer Father        Prostate   Hypertension Brother    Alzheimer's disease Maternal Grandmother    Cancer Maternal Grandfather        Lung   Diabetes  Paternal Grandmother    Stroke Paternal Grandfather    Breast cancer Neg Hx    Ovarian cancer Neg Hx    Colon cancer Neg Hx     Past medical history, surgical history, medications, allergies, family history and social history reviewed with patient today and changes made to appropriate areas of the chart.   Review of Systems  Constitutional:  Positive for diaphoresis. Negative for chills, fever, malaise/fatigue and weight loss.  HENT: Negative.    Eyes: Negative.   Respiratory: Negative.    Cardiovascular: Negative.   Gastrointestinal: Negative.   Genitourinary: Negative.   Musculoskeletal:  Positive for myalgias. Negative for back pain, falls, joint pain and neck pain.  Skin: Negative.   Neurological: Negative.   Endo/Heme/Allergies: Negative.   Psychiatric/Behavioral: Negative.    All other ROS negative except what is listed above and in the HPI.      Objective:    BP 115/80   Pulse  62   Temp 98.1 F (36.7 C)   Ht 5\' 5"  (1.651 m)   Wt 179 lb 9.6 oz (81.5 kg)   SpO2 98%   BMI 29.89 kg/m   Wt Readings from Last 3 Encounters:  11/14/21 179 lb 9.6 oz (81.5 kg)  06/26/21 177 lb 3.2 oz (80.4 kg)  05/19/21 174 lb 6.4 oz (79.1 kg)    Physical Exam Vitals and nursing note reviewed.  Constitutional:      General: She is not in acute distress.    Appearance: Normal appearance. She is not ill-appearing, toxic-appearing or diaphoretic.  HENT:     Head: Normocephalic and atraumatic.     Right Ear: Tympanic membrane, ear canal and external ear normal. There is no impacted cerumen.     Left Ear: Tympanic membrane, ear canal and external ear normal. There is no impacted cerumen.     Nose: Nose normal. No congestion or rhinorrhea.     Mouth/Throat:     Mouth: Mucous membranes are moist.     Pharynx: Oropharynx is clear. No oropharyngeal exudate or posterior oropharyngeal erythema.  Eyes:     General: No scleral icterus.       Right eye: No discharge.        Left eye: No discharge.     Extraocular Movements: Extraocular movements intact.     Conjunctiva/sclera: Conjunctivae normal.     Pupils: Pupils are equal, round, and reactive to light.  Neck:     Vascular: No carotid bruit.  Cardiovascular:     Rate and Rhythm: Normal rate and regular rhythm.     Pulses: Normal pulses.     Heart sounds: No murmur heard.   No friction rub. No gallop.  Pulmonary:     Effort: Pulmonary effort is normal. No respiratory distress.     Breath sounds: Normal breath sounds. No stridor. No wheezing, rhonchi or rales.  Chest:     Chest wall: No tenderness.  Abdominal:     General: Abdomen is flat. Bowel sounds are normal. There is no distension.     Palpations: Abdomen is soft. There is no mass.     Tenderness: There is no abdominal tenderness. There is no right CVA tenderness, left CVA tenderness, guarding or rebound.     Hernia: No hernia is present.  Genitourinary:    Comments:  Breast and pelvic exams deferred with shared decision making Musculoskeletal:        General: No swelling, tenderness, deformity or signs of injury.  Cervical back: Normal range of motion and neck supple. No rigidity. No muscular tenderness.     Right lower leg: No edema.     Left lower leg: No edema.  Lymphadenopathy:     Cervical: No cervical adenopathy.  Skin:    General: Skin is warm and dry.     Capillary Refill: Capillary refill takes less than 2 seconds.     Coloration: Skin is not jaundiced or pale.     Findings: No bruising, erythema, lesion or rash.  Neurological:     General: No focal deficit present.     Mental Status: She is alert and oriented to person, place, and time. Mental status is at baseline.     Cranial Nerves: No cranial nerve deficit.     Sensory: No sensory deficit.     Motor: No weakness.     Coordination: Coordination normal.     Gait: Gait normal.     Deep Tendon Reflexes: Reflexes normal.  Psychiatric:        Mood and Affect: Mood normal.        Behavior: Behavior normal.        Thought Content: Thought content normal.        Judgment: Judgment normal.    Results for orders placed or performed in visit on 11/14/21  Microscopic Examination   Urine  Result Value Ref Range   WBC, UA 0-5 0 - 5 /hpf   RBC 11-30 (A) 0 - 2 /hpf   Epithelial Cells (non renal) 0-10 0 - 10 /hpf   Bacteria, UA None seen None seen/Few  Urinalysis, Routine w reflex microscopic  Result Value Ref Range   Specific Gravity, UA 1.015 1.005 - 1.030   pH, UA 5.5 5.0 - 7.5   Color, UA Yellow Yellow   Appearance Ur Cloudy (A) Clear   Leukocytes,UA 1+ (A) Negative   Protein,UA Negative Negative/Trace   Glucose, UA Negative Negative   Ketones, UA Negative Negative   RBC, UA 3+ (A) Negative   Bilirubin, UA Negative Negative   Urobilinogen, Ur 0.2 0.2 - 1.0 mg/dL   Nitrite, UA Negative Negative   Microscopic Examination See below:       Assessment & Plan:   Problem List  Items Addressed This Visit   None Visit Diagnoses     Routine general medical examination at a health care facility    -  Primary   Vacines up to date. Screening labs checked today. Pap up to date. Mammogram orderd. Will think about colon screening. Call with any concerns. Conitnue to [facility]   Relevant Orders   CBC with Differential/Platelet   Comprehensive metabolic panel   Lipid Panel w/o Chol/HDL Ratio   Urinalysis, Routine w reflex microscopic (Completed)   TSH   Perimenopause       Labs drawn today. Await results.    Relevant Orders   LH   [place]   Estradiol   Encounter for screening mammogram for malignant neoplasm of breast       Mammogram odered today.   Relevant Orders   MM DIAG BREAST TOMO BILATERAL        Follow up plan: Return in about 1 year (around 11/14/2022) for physical, 3 and 9 months for shingles vaccines .   LABORATORY TESTING:  - Pap smear: up to date  IMMUNIZATIONS:   - Tdap: Tetanus vaccination status reviewed: last tetanus booster within 10 years. - Influenza: Up to date - Pneumovax: Not applicable - Prevnar: Not  applicable - COVID: Refused - HPV: Not applicable - Shingrix vaccine:  To be given in 3 months  SCREENING: -Mammogram: Ordered today  - Colonoscopy: Ordered today   PATIENT COUNSELING:   Advised to take 1 mg of folate supplement per day if capable of pregnancy.   Sexuality: Discussed sexually transmitted diseases, partner selection, use of condoms, avoidance of unintended pregnancy  and contraceptive alternatives.   Advised to avoid cigarette smoking.  I discussed with the patient that most people either abstain from alcohol or drink within safe limits (<=14/week and <=4 drinks/occasion for males, <=7/weeks and <= 3 drinks/occasion for females) and that the risk for alcohol disorders and other health effects rises proportionally with the number of drinks per week and how often a drinker exceeds daily limits.  Discussed  cessation/primary prevention of drug use and availability of treatment for abuse.   Diet: Encouraged to adjust caloric intake to maintain  or achieve ideal body weight, to reduce intake of dietary saturated fat and total fat, to limit sodium intake by avoiding high sodium foods and not adding table salt, and to maintain adequate dietary potassium and calcium preferably from fresh fruits, vegetables, and low-fat dairy products.    stressed the importance of regular exercise  Injury prevention: Discussed safety belts, safety helmets, smoke detector, smoking near bedding or upholstery.   Dental health: Discussed importance of regular tooth brushing, flossing, and dental visits.    NEXT PREVENTATIVE PHYSICAL DUE IN 1 YEAR. Return in about 1 year (around 11/14/2022) for physical, 3 and 9 months for shingles vaccines .

## 2021-11-15 LAB — LIPID PANEL W/O CHOL/HDL RATIO
Cholesterol, Total: 317 mg/dL — ABNORMAL HIGH (ref 100–199)
HDL: 49 mg/dL (ref >39)
LDL Chol Calc (NIH): 230 mg/dL — ABNORMAL HIGH (ref 0–99)
Triglycerides: 195 mg/dL — ABNORMAL HIGH (ref 0–149)
VLDL Cholesterol Cal: 38 mg/dL (ref 5–40)

## 2021-11-15 LAB — COMPREHENSIVE METABOLIC PANEL
ALT: 17 IU/L (ref 0–32)
AST: 20 IU/L (ref 0–40)
Albumin/Globulin Ratio: 1.8 (ref 1.2–2.2)
Albumin: 4.6 g/dL (ref 3.8–4.9)
Alkaline Phosphatase: 70 IU/L (ref 44–121)
BUN/Creatinine Ratio: 13 (ref 9–23)
BUN: 13 mg/dL (ref 6–24)
Bilirubin Total: 0.9 mg/dL (ref 0.0–1.2)
CO2: 24 mmol/L (ref 20–29)
Calcium: 9.8 mg/dL (ref 8.7–10.2)
Chloride: 99 mmol/L (ref 96–106)
Creatinine, Ser: 0.97 mg/dL (ref 0.57–1.00)
Globulin, Total: 2.6 g/dL (ref 1.5–4.5)
Glucose: 80 mg/dL (ref 70–99)
Potassium: 4 mmol/L (ref 3.5–5.2)
Sodium: 138 mmol/L (ref 134–144)
Total Protein: 7.2 g/dL (ref 6.0–8.5)
eGFR: 70 mL/min/{1.73_m2} (ref >59)

## 2021-11-15 LAB — CBC WITH DIFFERENTIAL/PLATELET
Basophils Absolute: 0 10*3/uL (ref 0.0–0.2)
Basos: 1 %
EOS (ABSOLUTE): 0.1 10*3/uL (ref 0.0–0.4)
Eos: 2 %
Hematocrit: 38.5 % (ref 34.0–46.6)
Hemoglobin: 13.1 g/dL (ref 11.1–15.9)
Immature Grans (Abs): 0 10*3/uL (ref 0.0–0.1)
Immature Granulocytes: 0 %
Lymphocytes Absolute: 1.9 10*3/uL (ref 0.7–3.1)
Lymphs: 33 %
MCH: 28.9 pg (ref 26.6–33.0)
MCHC: 34 g/dL (ref 31.5–35.7)
MCV: 85 fL (ref 79–97)
Monocytes Absolute: 0.4 10*3/uL (ref 0.1–0.9)
Monocytes: 7 %
Neutrophils Absolute: 3.3 10*3/uL (ref 1.4–7.0)
Neutrophils: 57 %
Platelets: 234 10*3/uL (ref 150–450)
RBC: 4.54 x10E6/uL (ref 3.77–5.28)
RDW: 12.9 % (ref 11.7–15.4)
WBC: 5.8 10*3/uL (ref 3.4–10.8)

## 2021-11-15 LAB — TSH: TSH: 1.49 u[IU]/mL (ref 0.450–4.500)

## 2021-11-15 LAB — LUTEINIZING HORMONE: LH: 34.1 m[IU]/mL

## 2021-11-15 LAB — ESTRADIOL: Estradiol: 114 pg/mL

## 2021-11-15 LAB — FOLLICLE STIMULATING HORMONE: FSH: 28 m[IU]/mL

## 2021-12-15 ENCOUNTER — Encounter: Payer: Self-pay | Admitting: Family Medicine

## 2022-01-05 ENCOUNTER — Ambulatory Visit: Payer: BC Managed Care – PPO | Admitting: Dermatology

## 2022-01-05 ENCOUNTER — Other Ambulatory Visit: Payer: Self-pay

## 2022-01-05 DIAGNOSIS — L718 Other rosacea: Secondary | ICD-10-CM | POA: Diagnosis not present

## 2022-01-05 DIAGNOSIS — L720 Epidermal cyst: Secondary | ICD-10-CM

## 2022-01-05 DIAGNOSIS — L719 Rosacea, unspecified: Secondary | ICD-10-CM

## 2022-01-05 DIAGNOSIS — I781 Nevus, non-neoplastic: Secondary | ICD-10-CM | POA: Diagnosis not present

## 2022-01-05 DIAGNOSIS — L304 Erythema intertrigo: Secondary | ICD-10-CM

## 2022-01-05 MED ORDER — TRETINOIN 0.025 % EX GEL: CUTANEOUS | 6 refills | Status: DC

## 2022-01-05 NOTE — Progress Notes (Signed)
Follow-Up Visit   Subjective  Julie Patel is a 56 y.o. female who presents for the following: Follow-up (Patient here today for 4 month follow up. Patient reports that she notice a small rough spot under neck, she noticed that she has sensation at area. Patient would also like to discuss redness at face. Patient here today for follow up for feet and intertrigo. ).  The following portions of the chart were reviewed this encounter and updated as appropriate:  Tobacco   Allergies   Meds   Problems   Med Hx   Surg Hx   Fam Hx      Review of Systems: No other skin or systemic complaints except as noted in HPI or Assessment and Plan.  Objective  Well appearing patient in no apparent distress; mood and affect are within normal limits.  A focused examination was performed including face; neck; feet. Relevant physical exam findings are noted in the Assessment and Plan.  face Mid face erythema with telangiectasias +/- scattered inflammatory papules.   face Smooth white papule(s).   right tip of nose - Dilated blood vessel  Assessment & Plan  Rosacea -erythrotelangiectatic type face Rosacea is a chronic progressive skin condition usually affecting the face of adults, causing redness and/or acne bumps. It is treatable but not curable. It sometimes affects the eyes (ocular rosacea) as well. It may respond to topical and/or systemic medication and can flare with stress, sun exposure, alcohol, exercise and some foods.  Daily application of broad spectrum spf 30+ sunscreen to face is recommended to reduce flares.  Discussed the treatment option of BBL/laser.  Typically we recommend 1-3 treatment sessions about 5-8 weeks apart for best results.  The patient's condition may require "maintenance treatments" in the future.  The fee for BBL / laser treatments is $350 per treatment session for the whole face.  A fee can be quoted for other parts of the body. Insurance typically does not pay for  BBL/laser treatments and therefore the fee is an out-of-pocket cost.  Recommend fall / winter months   Milia face Start tretinion 0.025 % use a spot treatment to affected areas at nightly.   Topical retinoid medications like tretinoin/Retin-A, adapalene/Differin, tazarotene/Fabior, and Epiduo/Epiduo Forte can cause dryness and irritation when first started. Only apply a pea-sized amount to the entire affected area. Avoid applying it around the eyes, edges of mouth and creases at the nose. If you experience irritation, use a good moisturizer first and/or apply the medicine less often. If you are doing well with the medicine, you can increase how often you use it until you are applying every night. Be careful with sun protection while using this medication as it can make you sensitive to the sun. This medicine should not be used by pregnant women.   Related Medications tretinoin (RETIN-A) 0.025 % gel Use a small layer topically as a spot treatment to affected areas of face nightly.  Telangiectasia right tip of nose - Benign appearing on exam - Call for changes  Erythema intertrigo Right 2nd Proximal Dorsal Toe; Right 2nd-3rd Toe Web Intertrigo is a chronic recurrent rash that occurs in skin fold areas that may be associated with friction; heat; moisture; yeast; fungus; and bacteria.  It is exacerbated by increased movement / activity; sweating; and higher atmospheric temperature.  Start [facility] intertrigo cream.  Rx sent  Return if symptoms worsen or fail to improve. IRuthell Rummage, CMA, am acting as scribe for Sarina Ser, MD.  Documentation: I have reviewed the above documentation for accuracy and completeness, and I agree with the above.  Sarina Ser, MD

## 2022-01-05 NOTE — Patient Instructions (Addendum)
For Rosacea at face Discussed the treatment option of BBL/laser.  Typically we recommend 1-3 treatment sessions about 5-8 weeks apart for best results.  The patient's condition may require "maintenance treatments" in the future.  The fee for BBL / laser treatments is $350 per treatment session for the whole face.  A fee can be quoted for other parts of the body. Insurance typically does not pay for BBL/laser treatments and therefore the fee is an out-of-pocket cost.   For White spots at face (Milia)  Start tretinion 0.025 % use as a spot treatment to affected areas at nightly. Rx sent to [facility]  Topical retinoid medications like tretinoin/Retin-A, adapalene/Differin, tazarotene/Fabior, and Epiduo/Epiduo Forte can cause dryness and irritation when first started. Only apply a pea-sized amount to the entire affected area. Avoid applying it around the eyes, edges of mouth and creases at the nose. If you experience irritation, use a good moisturizer first and/or apply the medicine less often. If you are doing well with the medicine, you can increase how often you use it until you are applying every night. Be careful with sun protection while using this medication as it can make you sensitive to the sun. This medicine should not be used by pregnant women.    For Intertrigo (between toes) Apply in between toes Start [facility] Iodoquinol 1%, Hydrocortisone 2.5%, Niacinamide 2% Cream twice a day to affected areas for up to two weeks.  The patient was advised this is not covered by insurance since it is made by a compounding pharmacy. They will receive an email to check out and the medication will be mailed to their home.    Prescription sent to [facility]  Instructions for [facility] Medications  One or more of your medications was sent to the [facility] mail order compounding pharmacy. You will receive an email from them and can purchase the  medicine through that link. It will then be mailed to your home at the address you confirmed. If for any reason you do not receive an email from them, please check your spam folder. If you still do not find the email, please let us know. [facility] phone number is [phone].     Seborrheic Keratosis  What causes seborrheic keratoses? Seborrheic keratoses are harmless, common skin growths that first appear during adult life.  As time goes by, more growths appear.  Some people may develop a large number of them.  Seborrheic keratoses appear on both covered and uncovered body parts.  They are not caused by sunlight.  The tendency to develop seborrheic keratoses can be inherited.  They vary in color from skin-colored to gray, brown, or even black.  They can be either smooth or have a rough, warty surface.   Seborrheic keratoses are superficial and look as if they were stuck on the skin.  Under the microscope this type of keratosis looks like layers upon layers of skin.  That is why at times the top layer may seem to fall off, but the rest of the growth remains and re-grows.    Treatment Seborrheic keratoses do not need to be treated, but can easily be removed in the office.  Seborrheic keratoses often cause symptoms when they rub on clothing or jewelry.  Lesions can be in the way of shaving.  If they become inflamed, they can cause itching, soreness, or burning.  Removal of a seborrheic keratosis can be accomplished by freezing, burning, or surgery. If any  spot bleeds, scabs, or grows rapidly, please return to have it checked, as these can be an indication of a skin cancer.    If You Need Anything After Your Visit  If you have any questions or concerns for your doctor, please call our main line at [phone] and press option 4 to reach your doctor's medical assistant. If no one answers, please leave a voicemail as directed and we will return your call as soon as possible. Messages left after  4 pm will be answered the following business day.   You may also send [place] a message via [place]. We typically respond to MyChart messages within 1-2 business days.  For prescription refills, please ask your pharmacy to contact our office. Our fax number is [phone].  If you have an urgent issue when the clinic is closed that cannot wait until the next business day, you can page your doctor at the number below.    Please note that while we do our best to be available for urgent issues outside of office hours, we are not available 24/7.   If you have an urgent issue and are unable to reach [place], you may choose to seek medical care at your doctor's office, retail clinic, urgent care center, or emergency room.  If you have a medical emergency, please immediately call 911 or go to the emergency department.  Pager Numbers  - Dr. Nehemiah Massed: [phone]  - Dr. Laurence Ferrari: [phone]  - Dr. Nicole Kindred: [phone]  In the event of inclement weather, please call our main line at [phone] for an update on the status of any delays or closures.  Dermatology Medication Tips: Please keep the boxes that topical medications come in in order to help keep track of the instructions about where and how to use these. Pharmacies typically print the medication instructions only on the boxes and not directly on the medication tubes.   If your medication is too expensive, please contact our office at [phone] option 4 or send [place] a message through [place].   We are unable to tell what your co-pay for medications will be in advance as this is different depending on your insurance coverage. However, we may be able to find a substitute medication at lower cost or fill out paperwork to get insurance to cover a needed medication.   If a prior authorization is required to get your medication covered by your insurance company, please allow [place] 1-2 business days to complete this process.  Drug prices often vary  depending on where the prescription is filled and some pharmacies may offer cheaper prices.  The website www.goodrx.com contains coupons for medications through different pharmacies. The prices here do not account for what the cost may be with help from insurance (it may be cheaper with your insurance), but the website can give you the price if you did not use any insurance.  - You can print the associated coupon and take it with your prescription to the pharmacy.  - You may also stop by our office during regular business hours and pick up a GoodRx coupon card.  - If you need your prescription sent electronically to a different pharmacy, notify our office through [facility] or by phone at [phone] option 4.     Si Usted Necesita Algo Despus de Su Visita  Tambin puede enviarnos un mensaje a travs de Pharmacist, community. Por lo general respondemos a los mensajes de MyChart en el transcurso de 1 a 2 das hbiles.  Para  renovar recetas, por favor pida a su farmacia que se ponga en contacto con nuestra oficina. Harland Dingwall de fax es [place] [phone].  Si tiene un asunto urgente cuando la clnica est cerrada y que no puede esperar hasta el siguiente da hbil, puede llamar/localizar a su doctor(a) al nmero que aparece a continuacin.   Por favor, tenga en cuenta que aunque hacemos todo lo posible para estar disponibles para asuntos urgentes fuera del horario de [place], no estamos disponibles las 24 horas del da, los 7 das de la [place].   Si tiene un problema urgente y no puede comunicarse con nosotros, puede optar por buscar atencin mdica  en el consultorio de su doctor(a), en una clnica privada, en un centro de atencin urgente o en una sala de emergencias.  Si tiene Engineering geologist, por favor llame inmediatamente al 911 o vaya a la sala de emergencias.  Nmeros de bper  - Dr. Nehemiah Massed: [phone]  - Dra. Moye: [phone]  - Dra. Nicole Kindred: [phone]  En caso de  inclemencias del [place], por favor llame a Johnsie Kindred principal al [phone] para una actualizacin sobre el [place] de cualquier retraso o cierre.  Consejos para la medicacin en dermatologa: Por favor, guarde las cajas en las que vienen los medicamentos de uso tpico para ayudarle a seguir las instrucciones sobre dnde y cmo usarlos. Las farmacias generalmente imprimen las instrucciones del medicamento slo en las cajas y no directamente en los tubos del [place].   Si su medicamento es muy caro, por favor, pngase en contacto con Zigmund Daniel llamando al [phone] y presione la opcin 4 o envenos un mensaje a travs de Pharmacist, community.   No podemos decirle cul ser su copago por los medicamentos por adelantado ya que esto es diferente dependiendo de la cobertura de su seguro. Sin embargo, es posible que podamos encontrar un medicamento sustituto a Electrical engineer un formulario para que el seguro cubra el medicamento que se considera necesario.   Si se requiere una autorizacin previa para que su compaa de seguros [place] su medicamento, por favor permtanos de 1 a 2 das hbiles para completar este proceso.  Los precios de los medicamentos varan con frecuencia dependiendo del Environmental consultant de dnde se surte la receta y alguna farmacias pueden ofrecer precios ms baratos.  El sitio web www.goodrx.com tiene cupones para medicamentos de Airline pilot. Los precios aqu no tienen en cuenta lo que podra costar con la ayuda del seguro (puede ser ms barato con su seguro), pero el sitio web puede darle el precio si no utiliz Research scientist (physical sciences).  - Puede imprimir el cupn correspondiente y llevarlo con su receta a la farmacia.  - Tambin puede pasar por nuestra oficina durante el horario de atencin regular y Charity fundraiser una tarjeta de cupones de GoodRx.  - Si necesita que su receta se enve electrnicamente a una farmacia diferente, informe a nuestra oficina a travs de MyChart de [place] o  por telfono llamando al [phone] y presione la opcin 4.

## 2022-01-06 ENCOUNTER — Encounter: Payer: Self-pay | Admitting: Dermatology

## 2022-02-13 ENCOUNTER — Ambulatory Visit: Payer: BC Managed Care – PPO

## 2022-04-16 ENCOUNTER — Ambulatory Visit: Payer: BC Managed Care – PPO | Admitting: Internal Medicine

## 2022-04-16 ENCOUNTER — Encounter: Payer: Self-pay | Admitting: Internal Medicine

## 2022-04-16 ENCOUNTER — Ambulatory Visit: Payer: Self-pay

## 2022-04-16 VITALS — BP 132/77 | HR 73 | Temp 98.4°F | Ht 65.0 in | Wt 174.4 lb

## 2022-04-16 DIAGNOSIS — N946 Dysmenorrhea, unspecified: Secondary | ICD-10-CM | POA: Diagnosis not present

## 2022-04-16 DIAGNOSIS — R102 Pelvic and perineal pain: Secondary | ICD-10-CM | POA: Diagnosis not present

## 2022-04-16 DIAGNOSIS — N921 Excessive and frequent menstruation with irregular cycle: Secondary | ICD-10-CM | POA: Diagnosis not present

## 2022-04-16 MED ORDER — NAPROXEN 500 MG PO TABS: 500.0000 mg | ORAL_TABLET | Freq: Two times a day (BID) | ORAL | 0 refills | Status: AC

## 2022-04-16 NOTE — Telephone Encounter (Signed)
Attempted to reach emergency contact husband. No answer. ?

## 2022-04-16 NOTE — Progress Notes (Signed)
? ?BP 132/77   Pulse 73   Temp 98.4 ?F (36.9 ?C) (Oral)   Ht $R'5\' 5"'im$  (1.651 m)   Wt 174 lb 6.4 oz (79.1 kg)   SpO2 98%   BMI 29.02 kg/m?   ? ?Subjective:  ? ? Patient ID: Julie Patel, female    DOB: August 16, 1966, 56 y.o.   MRN: [id number] ? ?Chief Complaint  ?Patient presents with  ?? Cramps  ?  Started yesterday  ? ? ?HPI: ?Julie Patel is a 56 y.o. female ? ?Pt has abdominal cramping and is passing blood clots  ? ?Abdominal Pain ?This is a new (lower pelvic pain and cramping has happened last few times, not very regular) problem. The current episode started yesterday. The abdominal pain does not radiate. Pertinent negatives include no anorexia, arthralgias, belching, constipation, diarrhea, dysuria, fever, flatus, frequency, headaches, hematochezia, hematuria, melena, myalgias, nausea, vomiting or weight loss.  ? ?Chief Complaint  ?Patient presents with  ?? Cramps  ?  Started yesterday  ? ? ?Relevant past medical, surgical, family and social history reviewed and updated as indicated. Interim medical history since our last visit reviewed. ?Allergies and medications reviewed and updated. ? ?Review of Systems  ?Constitutional:  Negative for fever and weight loss.  ?Gastrointestinal:  Positive for abdominal pain. Negative for anorexia, constipation, diarrhea, flatus, hematochezia, melena, nausea and vomiting.  ?Genitourinary:  Negative for dysuria, frequency and hematuria.  ?Musculoskeletal:  Negative for arthralgias and myalgias.  ?Neurological:  Negative for headaches.  ? ?Per HPI unless specifically indicated above ? ?   ?Objective:  ?  ?BP 132/77   Pulse 73   Temp 98.4 ?F (36.9 ?C) (Oral)   Ht $R'5\' 5"'oQ$  (1.651 m)   Wt 174 lb 6.4 oz (79.1 kg)   SpO2 98%   BMI 29.02 kg/m?   ?Wt Readings from Last 3 Encounters:  ?04/16/22 174 lb 6.4 oz (79.1 kg)  ?11/14/21 179 lb 9.6 oz (81.5 kg)  ?06/26/21 177 lb 3.2 oz (80.4 kg)  ?  ?Physical Exam ?Vitals and nursing note reviewed.  ?Constitutional:   ?   General:  She is not in acute distress. ?   Appearance: Normal appearance. She is not ill-appearing or diaphoretic.  ?Eyes:  ?   Conjunctiva/sclera: Conjunctivae normal.  ?Pulmonary:  ?   Breath sounds: No rhonchi.  ?Abdominal:  ?   General: Abdomen is flat. Bowel sounds are normal. There is no distension.  ?   Palpations: Abdomen is soft. There is no mass.  ?   Tenderness: There is no abdominal tenderness. There is no guarding.  ?   Hernia: No hernia is present.  ?Skin: ?   General: Skin is warm and dry.  ?   Coloration: Skin is not jaundiced or pale.  ?   Findings: No bruising, erythema, lesion or rash.  ?Neurological:  ?   Mental Status: She is alert.  ? ? ?Results for orders placed or performed in visit on 11/14/21  ?Microscopic Examination  ? Urine  ?Result Value Ref Range  ? WBC, UA 0-5 0 - 5 /hpf  ? RBC 11-30 (A) 0 - 2 /hpf  ? Epithelial Cells (non renal) 0-10 0 - 10 /hpf  ? Bacteria, UA None seen None seen/Few  ?CBC with Differential/Platelet  ?Result Value Ref Range  ? WBC 5.8 3.4 - 10.8 x10E3/uL  ? RBC 4.54 3.77 - 5.28 x10E6/uL  ? Hemoglobin 13.1 11.1 - 15.9 g/dL  ? Hematocrit 38.5 34.0 - 46.6 %  ?  MCV 85 79 - 97 fL  ? MCH 28.9 26.6 - 33.0 pg  ? MCHC 34.0 31.5 - 35.7 g/dL  ? RDW 12.9 11.7 - 15.4 %  ? Platelets 234 150 - 450 x10E3/uL  ? Neutrophils 57 Not Estab. %  ? Lymphs 33 Not Estab. %  ? Monocytes 7 Not Estab. %  ? Eos 2 Not Estab. %  ? Basos 1 Not Estab. %  ? Neutrophils Absolute 3.3 1.4 - 7.0 x10E3/uL  ? Lymphocytes Absolute 1.9 0.7 - 3.1 x10E3/uL  ? Monocytes Absolute 0.4 0.1 - 0.9 x10E3/uL  ? EOS (ABSOLUTE) 0.1 0.0 - 0.4 x10E3/uL  ? Basophils Absolute 0.0 0.0 - 0.2 x10E3/uL  ? Immature Granulocytes 0 Not Estab. %  ? Immature Grans (Abs) 0.0 0.0 - 0.1 x10E3/uL  ?Comprehensive metabolic panel  ?Result Value Ref Range  ? Glucose 80 70 - 99 mg/dL  ? BUN 13 6 - 24 mg/dL  ? Creatinine, Ser 0.97 0.57 - 1.00 mg/dL  ? eGFR 70 >59 mL/min/1.73  ? BUN/Creatinine Ratio 13 9 - 23  ? Sodium 138 134 - 144 mmol/L  ?  Potassium 4.0 3.5 - 5.2 mmol/L  ? Chloride 99 96 - 106 mmol/L  ? CO2 24 20 - 29 mmol/L  ? Calcium 9.8 8.7 - 10.2 mg/dL  ? Total Protein 7.2 6.0 - 8.5 g/dL  ? Albumin 4.6 3.8 - 4.9 g/dL  ? Globulin, Total 2.6 1.5 - 4.5 g/dL  ? Albumin/Globulin Ratio 1.8 1.2 - 2.2  ? Bilirubin Total 0.9 0.0 - 1.2 mg/dL  ? Alkaline Phosphatase 70 44 - 121 IU/L  ? AST 20 0 - 40 IU/L  ? ALT 17 0 - 32 IU/L  ?Lipid Panel w/o Chol/HDL Ratio  ?Result Value Ref Range  ? Cholesterol, Total 317 (H) 100 - 199 mg/dL  ? Triglycerides 195 (H) 0 - 149 mg/dL  ? HDL 49 >39 mg/dL  ? VLDL Cholesterol Cal 38 5 - 40 mg/dL  ? LDL Chol Calc (NIH) 230 (H) 0 - 99 mg/dL  ? Lipid Comment: Comment   ?Urinalysis, Routine w reflex microscopic  ?Result Value Ref Range  ? Specific Gravity, UA 1.015 1.005 - 1.030  ? pH, UA 5.5 5.0 - 7.5  ? Color, UA Yellow Yellow  ? Appearance Ur Cloudy (A) Clear  ? Leukocytes,UA 1+ (A) Negative  ? Protein,UA Negative Negative/Trace  ? Glucose, UA Negative Negative  ? Ketones, UA Negative Negative  ? RBC, UA 3+ (A) Negative  ? Bilirubin, UA Negative Negative  ? Urobilinogen, Ur 0.2 0.2 - 1.0 mg/dL  ? Nitrite, UA Negative Negative  ? Microscopic Examination See below:   ?TSH  ?Result Value Ref Range  ? TSH 1.490 0.450 - 4.500 uIU/mL  ?Luteinizing hormone  ?Result Value Ref Range  ? LH 34.1 mIU/mL  ?Follicle stimulating hormone  ?Result Value Ref Range  ? FSH 28.0 mIU/mL  ?Estradiol  ?Result Value Ref Range  ? Estradiol 114.0 pg/mL  ? ?   ? ? ?Current Outpatient Medications:  ??  tretinoin (RETIN-A) 0.025 % gel, Use a small layer topically as a spot treatment to affected areas of face nightly., Disp: 45 g, Rfl: 6  ? ? ?Assessment & Plan:  ?Lower pelvic pain  ?Ho uterine fibroids  ?Will start pt on naproxen and refer to ob gyn as she appears to be in ditress and has had mennorragia and dysmennorrhea in the setting of fibroids  ?She has had irregular peirods which indicates she  might be perimenopausal however given her mennorrhagia and  excessive clotthing might need further investigations per ob gyn  ?Will have her fu with Dr. Lenna Sciara as well in 3-4 weeks ? ?Problem List Items Addressed This Visit   ?None ?  ? ?No orders of the defined types were placed in this encounter. ?  ? ?No orders of the defined types were placed in this encounter. ?  ? ?Follow up plan: ?No follow-ups on file. ? ?

## 2022-04-16 NOTE — Telephone Encounter (Signed)
[employer] agent attempting to transfer call. Call dropped. Called pt. Back. Voice mailbox full, unable to leave message. ?

## 2022-04-21 ENCOUNTER — Encounter: Payer: Self-pay | Admitting: Family Medicine

## 2022-04-21 ENCOUNTER — Ambulatory Visit: Payer: BC Managed Care – PPO | Admitting: Family Medicine

## 2022-04-21 VITALS — BP 114/69 | HR 71 | Temp 98.8°F | Wt 178.0 lb

## 2022-04-21 DIAGNOSIS — Z1211 Encounter for screening for malignant neoplasm of colon: Secondary | ICD-10-CM | POA: Diagnosis not present

## 2022-04-21 DIAGNOSIS — K641 Second degree hemorrhoids: Secondary | ICD-10-CM | POA: Diagnosis not present

## 2022-04-21 MED ORDER — HYDROCORTISONE ACETATE 25 MG RE SUPP: 25.0000 mg | Freq: Two times a day (BID) | RECTAL | 12 refills | Status: DC

## 2022-04-21 NOTE — Progress Notes (Signed)
? ?BP 114/69   Pulse 71   Temp 98.8 ?F (37.1 ?C)   Wt 178 lb (80.7 kg)   SpO2 98%   BMI 29.62 kg/m?   ? ?Subjective:  ? ? Patient ID: Julie Patel, female    DOB: 11-Sep-1966, 56 y.o.   MRN: [id number] ? ?HPI: ?Julie Patel is a 56 y.o. female ? ?Chief Complaint  ?Patient presents with  ? Hemorrhoids  ?  Patient states she noticed last Thursday she has a hemorrhoid has been using OTC rectal cream.   ? ?Hemorrhoids ?Duration: about 5 days ?Bright red rectal bleeding:  no ?Melena:  no ?Spotting on toilet tissue:  no ?Anal fullness:  yes ?Perianal pain:  yes ?Severity: mild ?Perianal irritation/itching:  no ?Constipation:  no ?Chronic straining/valsava:  yes ?Anal trauma/intercourse:  no ?Hemorrhoids:  yes ?Previous colonoscopy:  no ? ?Relevant past medical, surgical, family and social history reviewed and updated as indicated. Interim medical history since our last visit reviewed. ?Allergies and medications reviewed and updated. ? ?Review of Systems  ?Constitutional: Negative.   ?Respiratory: Negative.    ?Cardiovascular: Negative.   ?Gastrointestinal:  Positive for rectal pain. Negative for abdominal distention, abdominal pain, anal bleeding, blood in stool, constipation, diarrhea, nausea and vomiting.  ?Musculoskeletal: Negative.   ?Skin: Negative.   ?Psychiatric/Behavioral: Negative.    ? ?Per HPI unless specifically indicated above ? ?   ?Objective:  ?  ?BP 114/69   Pulse 71   Temp 98.8 ?F (37.1 ?C)   Wt 178 lb (80.7 kg)   SpO2 98%   BMI 29.62 kg/m?   ?Wt Readings from Last 3 Encounters:  ?04/21/22 178 lb (80.7 kg)  ?04/16/22 174 lb 6.4 oz (79.1 kg)  ?11/14/21 179 lb 9.6 oz (81.5 kg)  ?  ?Physical Exam ?Vitals and nursing note reviewed.  ?Constitutional:   ?   General: She is not in acute distress. ?   Appearance: Normal appearance. She is not ill-appearing, toxic-appearing or diaphoretic.  ?HENT:  ?   Head: Normocephalic and atraumatic.  ?   Right Ear: External ear normal.  ?   Left Ear:  External ear normal.  ?   Nose: Nose normal.  ?   Mouth/Throat:  ?   Mouth: Mucous membranes are moist.  ?   Pharynx: Oropharynx is clear.  ?Eyes:  ?   General: No scleral icterus.    ?   Right eye: No discharge.     ?   Left eye: No discharge.  ?   Extraocular Movements: Extraocular movements intact.  ?   Conjunctiva/sclera: Conjunctivae normal.  ?   Pupils: Pupils are equal, round, and reactive to light.  ?Cardiovascular:  ?   Rate and Rhythm: Normal rate and regular rhythm.  ?   Pulses: Normal pulses.  ?   Heart sounds: Normal heart sounds. No murmur heard. ?  No friction rub. No gallop.  ?Pulmonary:  ?   Effort: Pulmonary effort is normal. No respiratory distress.  ?   Breath sounds: Normal breath sounds. No stridor. No wheezing, rhonchi or rales.  ?Chest:  ?   Chest wall: No tenderness.  ?Genitourinary: ?   Rectum: External hemorrhoid (x2 about 3/4 inch each) present.  ?Musculoskeletal:     ?   General: Normal range of motion.  ?   Cervical back: Normal range of motion and neck supple.  ?Skin: ?   General: Skin is warm and dry.  ?   Capillary Refill: Capillary refill takes  less than 2 seconds.  ?   Coloration: Skin is not jaundiced or pale.  ?   Findings: No bruising, erythema, lesion or rash.  ?Neurological:  ?   General: No focal deficit present.  ?   Mental Status: She is alert and oriented to person, place, and time. Mental status is at baseline.  ?Psychiatric:     ?   Mood and Affect: Mood normal.     ?   Behavior: Behavior normal.     ?   Thought Content: Thought content normal.     ?   Judgment: Judgment normal.  ? ? ?Results for orders placed or performed in visit on 11/14/21  ?Microscopic Examination  ? Urine  ?Result Value Ref Range  ? WBC, UA 0-5 0 - 5 /hpf  ? RBC 11-30 (A) 0 - 2 /hpf  ? Epithelial Cells (non renal) 0-10 0 - 10 /hpf  ? Bacteria, UA None seen None seen/Few  ?CBC with Differential/Platelet  ?Result Value Ref Range  ? WBC 5.8 3.4 - 10.8 x10E3/uL  ? RBC 4.54 3.77 - 5.28 x10E6/uL  ?  Hemoglobin 13.1 11.1 - 15.9 g/dL  ? Hematocrit 38.5 34.0 - 46.6 %  ? MCV 85 79 - 97 fL  ? MCH 28.9 26.6 - 33.0 pg  ? MCHC 34.0 31.5 - 35.7 g/dL  ? RDW 12.9 11.7 - 15.4 %  ? Platelets 234 150 - 450 x10E3/uL  ? Neutrophils 57 Not Estab. %  ? Lymphs 33 Not Estab. %  ? Monocytes 7 Not Estab. %  ? Eos 2 Not Estab. %  ? Basos 1 Not Estab. %  ? Neutrophils Absolute 3.3 1.4 - 7.0 x10E3/uL  ? Lymphocytes Absolute 1.9 0.7 - 3.1 x10E3/uL  ? Monocytes Absolute 0.4 0.1 - 0.9 x10E3/uL  ? EOS (ABSOLUTE) 0.1 0.0 - 0.4 x10E3/uL  ? Basophils Absolute 0.0 0.0 - 0.2 x10E3/uL  ? Immature Granulocytes 0 Not Estab. %  ? Immature Grans (Abs) 0.0 0.0 - 0.1 x10E3/uL  ?Comprehensive metabolic panel  ?Result Value Ref Range  ? Glucose 80 70 - 99 mg/dL  ? BUN 13 6 - 24 mg/dL  ? Creatinine, Ser 0.97 0.57 - 1.00 mg/dL  ? eGFR 70 >59 mL/min/1.73  ? BUN/Creatinine Ratio 13 9 - 23  ? Sodium 138 134 - 144 mmol/L  ? Potassium 4.0 3.5 - 5.2 mmol/L  ? Chloride 99 96 - 106 mmol/L  ? CO2 24 20 - 29 mmol/L  ? Calcium 9.8 8.7 - 10.2 mg/dL  ? Total Protein 7.2 6.0 - 8.5 g/dL  ? Albumin 4.6 3.8 - 4.9 g/dL  ? Globulin, Total 2.6 1.5 - 4.5 g/dL  ? Albumin/Globulin Ratio 1.8 1.2 - 2.2  ? Bilirubin Total 0.9 0.0 - 1.2 mg/dL  ? Alkaline Phosphatase 70 44 - 121 IU/L  ? AST 20 0 - 40 IU/L  ? ALT 17 0 - 32 IU/L  ?Lipid Panel w/o Chol/HDL Ratio  ?Result Value Ref Range  ? Cholesterol, Total 317 (H) 100 - 199 mg/dL  ? Triglycerides 195 (H) 0 - 149 mg/dL  ? HDL 49 >39 mg/dL  ? VLDL Cholesterol Cal 38 5 - 40 mg/dL  ? LDL Chol Calc (NIH) 230 (H) 0 - 99 mg/dL  ? Lipid Comment: Comment   ?Urinalysis, Routine w reflex microscopic  ?Result Value Ref Range  ? Specific Gravity, UA 1.015 1.005 - 1.030  ? pH, UA 5.5 5.0 - 7.5  ? Color, UA Yellow Yellow  ?  Appearance Ur Cloudy (A) Clear  ? Leukocytes,UA 1+ (A) Negative  ? Protein,UA Negative Negative/Trace  ? Glucose, UA Negative Negative  ? Ketones, UA Negative Negative  ? RBC, UA 3+ (A) Negative  ? Bilirubin, UA Negative  Negative  ? Urobilinogen, Ur 0.2 0.2 - 1.0 mg/dL  ? Nitrite, UA Negative Negative  ? Microscopic Examination See below:   ?TSH  ?Result Value Ref Range  ? TSH 1.490 0.450 - 4.500 uIU/mL  ?Luteinizing hormone  ?Result Value Ref Range  ? LH 34.1 mIU/mL  ?Follicle stimulating hormone  ?Result Value Ref Range  ? FSH 28.0 mIU/mL  ?Estradiol  ?Result Value Ref Range  ? Estradiol 114.0 pg/mL  ? ?   ?Assessment & Plan:  ? ?Problem List Items Addressed This Visit   ?None ?Visit Diagnoses   ? ? Grade II hemorrhoids    -  Primary  ? Will treat with hydrocoritsone. Call if not getting better or getting worse.   ? Screening for colon cancer      ? Referral to GI placed today  ? Relevant Orders  ? Ambulatory referral to Gastroenterology  ? ?  ?  ? ?Follow up plan: ?Return if symptoms worsen or fail to improve. ? ? ? ? ? ?

## 2022-04-22 ENCOUNTER — Telehealth: Payer: Self-pay

## 2022-04-22 NOTE — Telephone Encounter (Signed)
Called patient no answer no voicemail doctor wants [facility] for patient ?

## 2022-05-19 ENCOUNTER — Encounter: Payer: Self-pay | Admitting: Family Medicine

## 2022-05-19 ENCOUNTER — Ambulatory Visit: Payer: BC Managed Care – PPO | Admitting: Family Medicine

## 2022-05-19 VITALS — BP 123/79 | HR 72 | Temp 98.1°F | Wt 183.2 lb

## 2022-05-19 DIAGNOSIS — K641 Second degree hemorrhoids: Secondary | ICD-10-CM

## 2022-05-19 MED ORDER — HYDROCORTISONE (PERIANAL) 2.5 % EX CREA: 1.0000 "application " | TOPICAL_CREAM | Freq: Two times a day (BID) | CUTANEOUS | 3 refills | Status: DC

## 2022-05-19 NOTE — Assessment & Plan Note (Signed)
Is very bothered by her hemorrhoid. Would like to discuss banding when she goes to see the GI for her colonoscopy. Will change referral for her. Call with any concerns.

## 2022-05-19 NOTE — Progress Notes (Signed)
BP 123/79   Pulse 72   Temp 98.1 F (36.7 C)   Wt 183 lb 3.2 oz (83.1 kg)   SpO2 98%   BMI 30.49 kg/m    Subjective:    Patient ID: Julie Patel, female    DOB: 21-Jul-1966, 56 y.o.   MRN: [id number]  HPI: Julie Patel is a 56 y.o. female  Chief Complaint  Patient presents with   Hemorrhoids    Patient states she still has a hemorrhoid, medications did not help.    HEMORRHOIDS Duration: months Anal fullness: yes Perianal itching/irritation: no Perianal pain: no Bright red rectal bleeding: yes Amount of blood: spotting Frequency: occasional Constipation: no Hard stools: no Chronic straining/valsava: no Status: stable Treatments attempted: OTC medicated wipes and hydrocortisone cream  Previous hemorrhoids: no  Colonoscopy: no  Relevant past medical, surgical, family and social history reviewed and updated as indicated. Interim medical history since our last visit reviewed. Allergies and medications reviewed and updated.  Review of Systems  Constitutional: Negative.   Respiratory: Negative.    Cardiovascular: Negative.   Gastrointestinal: Negative.   Genitourinary: Negative.   Musculoskeletal: Negative.   Neurological: Negative.   Psychiatric/Behavioral: Negative.      Per HPI unless specifically indicated above     Objective:    BP 123/79   Pulse 72   Temp 98.1 F (36.7 C)   Wt 183 lb 3.2 oz (83.1 kg)   SpO2 98%   BMI 30.49 kg/m   Wt Readings from Last 3 Encounters:  05/19/22 183 lb 3.2 oz (83.1 kg)  04/21/22 178 lb (80.7 kg)  04/16/22 174 lb 6.4 oz (79.1 kg)    Physical Exam Vitals and nursing note reviewed.  Constitutional:      General: She is not in acute distress.    Appearance: Normal appearance. She is well-developed.  HENT:     Head: Normocephalic and atraumatic.     Right Ear: Hearing and external ear normal.     Left Ear: Hearing and external ear normal.     Nose: Nose normal.     Mouth/Throat:     Mouth: Mucous  membranes are moist.     Pharynx: Oropharynx is clear.  Eyes:     General: Lids are normal. No scleral icterus.       Right eye: No discharge.        Left eye: No discharge.     Conjunctiva/sclera: Conjunctivae normal.  Pulmonary:     Effort: Pulmonary effort is normal. No respiratory distress.  Musculoskeletal:        General: Normal range of motion.  Skin:    Coloration: Skin is not jaundiced or pale.     Findings: No bruising, erythema, lesion or rash.  Neurological:     Mental Status: She is alert. Mental status is at baseline. She is disoriented.  Psychiatric:        Mood and Affect: Mood normal.        Speech: Speech normal.        Behavior: Behavior normal.        Thought Content: Thought content normal.        Judgment: Judgment normal.     Results for orders placed or performed in visit on 11/14/21  Microscopic Examination   Urine  Result Value Ref Range   WBC, UA 0-5 0 - 5 /hpf   RBC 11-30 (A) 0 - 2 /hpf   Epithelial Cells (non renal) 0-10 0 - 10 /  hpf   Bacteria, UA None seen None seen/Few  CBC with Differential/Platelet  Result Value Ref Range   WBC 5.8 3.4 - 10.8 x10E3/uL   RBC 4.54 3.77 - 5.28 x10E6/uL   Hemoglobin 13.1 11.1 - 15.9 g/dL   Hematocrit 38.5 34.0 - 46.6 %   MCV 85 79 - 97 fL   MCH 28.9 26.6 - 33.0 pg   MCHC 34.0 31.5 - 35.7 g/dL   RDW 12.9 11.7 - 15.4 %   Platelets 234 150 - 450 x10E3/uL   Neutrophils 57 Not Estab. %   Lymphs 33 Not Estab. %   Monocytes 7 Not Estab. %   Eos 2 Not Estab. %   Basos 1 Not Estab. %   Neutrophils Absolute 3.3 1.4 - 7.0 x10E3/uL   Lymphocytes Absolute 1.9 0.7 - 3.1 x10E3/uL   Monocytes Absolute 0.4 0.1 - 0.9 x10E3/uL   EOS (ABSOLUTE) 0.1 0.0 - 0.4 x10E3/uL   Basophils Absolute 0.0 0.0 - 0.2 x10E3/uL   Immature Granulocytes 0 Not Estab. %   Immature Grans (Abs) 0.0 0.0 - 0.1 x10E3/uL  Comprehensive metabolic panel  Result Value Ref Range   Glucose 80 70 - 99 mg/dL   BUN 13 6 - 24 mg/dL   Creatinine, Ser  0.97 0.57 - 1.00 mg/dL   eGFR 70 >59 mL/min/1.73   BUN/Creatinine Ratio 13 9 - 23   Sodium 138 134 - 144 mmol/L   Potassium 4.0 3.5 - 5.2 mmol/L   Chloride 99 96 - 106 mmol/L   CO2 24 20 - 29 mmol/L   Calcium 9.8 8.7 - 10.2 mg/dL   Total Protein 7.2 6.0 - 8.5 g/dL   Albumin 4.6 3.8 - 4.9 g/dL   Globulin, Total 2.6 1.5 - 4.5 g/dL   Albumin/Globulin Ratio 1.8 1.2 - 2.2   Bilirubin Total 0.9 0.0 - 1.2 mg/dL   Alkaline Phosphatase 70 44 - 121 IU/L   AST 20 0 - 40 IU/L   ALT 17 0 - 32 IU/L  Lipid Panel w/o Chol/HDL Ratio  Result Value Ref Range   Cholesterol, Total 317 (H) 100 - 199 mg/dL   Triglycerides 195 (H) 0 - 149 mg/dL   HDL 49 >39 mg/dL   VLDL Cholesterol Cal 38 5 - 40 mg/dL   LDL Chol Calc (NIH) 230 (H) 0 - 99 mg/dL   Lipid Comment: Comment   Urinalysis, Routine w reflex microscopic  Result Value Ref Range   Specific Gravity, UA 1.015 1.005 - 1.030   pH, UA 5.5 5.0 - 7.5   Color, UA Yellow Yellow   Appearance Ur Cloudy (A) Clear   Leukocytes,UA 1+ (A) Negative   Protein,UA Negative Negative/Trace   Glucose, UA Negative Negative   Ketones, UA Negative Negative   RBC, UA 3+ (A) Negative   Bilirubin, UA Negative Negative   Urobilinogen, Ur 0.2 0.2 - 1.0 mg/dL   Nitrite, UA Negative Negative   Microscopic Examination See below:   TSH  Result Value Ref Range   TSH 1.490 0.450 - 4.500 uIU/mL  Luteinizing hormone  Result Value Ref Range   LH 69.4 mIU/mL  Follicle stimulating hormone  Result Value Ref Range   FSH 28.0 mIU/mL  Estradiol  Result Value Ref Range   Estradiol 114.0 pg/mL      Assessment & Plan:   Problem List Items Addressed This Visit       Cardiovascular and Mediastinum   Grade II hemorrhoids - Primary    Is very bothered  by her hemorrhoid. Would like to discuss banding when she goes to see the GI for her colonoscopy. Will change referral for her. Call with any concerns.         Follow up plan: Return if symptoms worsen or fail to  improve.  >15 minutes spent with patient and her husband today

## 2022-06-18 ENCOUNTER — Ambulatory Visit: Payer: BC Managed Care – PPO | Admitting: Obstetrics

## 2022-06-18 ENCOUNTER — Encounter: Payer: Self-pay | Admitting: Obstetrics

## 2022-06-18 VITALS — BP 134/84 | HR 68 | Ht 65.0 in | Wt 179.3 lb

## 2022-06-18 DIAGNOSIS — N950 Postmenopausal bleeding: Secondary | ICD-10-CM

## 2022-06-18 NOTE — Progress Notes (Signed)
GYN ENCOUNTER  Subjective  HPI: Julie Patel is a 56 y.o. G2P1011 who presents today to discuss abnormal vaginal bleeding and cramping. Julie Patel has a history of heavy menstrual bleeding and fibroids. She had an endometrial ablation in 2018, which stopped her period for a few years. She began menstruating again a few years later. She had several months of very heavy bleeding and cramping. The bleeding and cramping has since stopped, except for one episode of cramping in May. She does not know the exact date of her last period, but believes it was probably in May. She had planned to come in for an evaluation for repeat ablation, but since the bleeding and cramping has stopped she was not sure if she still wanted to do that. She denies other menopausal symptoms.  Past Medical History:  Diagnosis Date   Abnormal uterine bleeding    Anemia    GERD (gastroesophageal reflux disease)    occ   Neck pain    Past Surgical History:  Procedure Laterality Date   ABLATION     DILITATION & CURRETTAGE/HYSTROSCOPY WITH NOVASURE ABLATION N/A 03/08/2017   Procedure: DILATATION & CURETTAGE/HYSTEROSCOPY WITH NOVASURE ABLATION;  Surgeon: Brayton Mars, MD;  Location: [facility] ORS;  Service: Gynecology;  Laterality: N/A;   WISDOM TOOTH EXTRACTION     OB History     Gravida  2   Para  1   Term  1   Preterm      AB  1   Living  1      SAB      IAB  1   Ectopic      Multiple      Live Births  1          Allergies  Allergen Reactions   Codeine Nausea And Vomiting   ROS Negative except as noted in HPI History obtained from the patient  Objective  BP 134/84   Pulse 68   Ht '5\' 5"'$  (1.651 m)   Wt 179 lb 4.8 oz (81.3 kg)   LMP  (LMP Unknown)   BMI 29.84 kg/m   Exam deferred. Not medically necessary for consultation.  Assessment 1) History of abnormal bleeding and cramping 2) Possible menopause  Plan Discussed likelihood that she may be entering menopause. Reviewed  diagnosis of menopause (12 months with no menstruation), possibility of endometrial abnormalities given history of heavy bleeding despite ablation. Offered watchful waiting or pelvic ultrasound. Julie Patel would like to have a pelvic US done. [place] ordered. If bleeding resumes, recommend f/u with Dr. Marcelline Mates for endometrial biopsy or evaluation for ablation.  Lloyd Huger, CNM

## 2022-07-06 ENCOUNTER — Other Ambulatory Visit: Payer: BC Managed Care – PPO

## 2022-07-07 ENCOUNTER — Other Ambulatory Visit: Payer: BC Managed Care – PPO

## 2022-08-14 ENCOUNTER — Ambulatory Visit: Payer: BC Managed Care – PPO

## 2022-11-12 ENCOUNTER — Encounter: Payer: Self-pay | Admitting: Family Medicine

## 2022-11-12 ENCOUNTER — Ambulatory Visit: Payer: BC Managed Care – PPO | Admitting: Family Medicine

## 2022-11-12 VITALS — BP 131/80 | HR 71 | Temp 98.8°F | Ht 65.0 in | Wt 185.3 lb

## 2022-11-12 DIAGNOSIS — R55 Syncope and collapse: Secondary | ICD-10-CM | POA: Diagnosis not present

## 2022-11-12 NOTE — Progress Notes (Signed)
BP 131/80   Pulse 71   Temp 98.8 F (37.1 C) (Oral)   Ht _0  (1.651 m)   Wt 185 lb 4.8 oz (84.1 kg)   SpO2 99%   BMI 30.84 kg/m    Subjective:    Patient ID: Julie Patel, female    DOB: September 23, 1966, 57 y.o.   MRN: [id number]  HPI: Julie Patel is a 56 y.o. female  Chief Complaint  Patient presents with   Loss of Consciousness    Patient says she was at work and blacked out. Patient says did a blood donation recently (2-3 weeks ago) and says she has not had done in a while due to causing a dip in her iron. Patient says she is not sure if that had something to do with it or not.    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 Was told that she "passed out" from sitting fell to the side- into a bookshelf. Did not feel funny before it happened. No confusion after. No loss of control of bowel or bladder. No sickness recently. No N/V, No CP, no SOB. She was not aware that she had passed out until she woke up. She is feeling OK now.   Relevant past medical, surgical, family and social history reviewed and updated as indicated. Interim medical history since our last visit reviewed. Allergies and medications reviewed and updated.  Review of Systems  Constitutional: Negative.   Respiratory: Negative.    Cardiovascular: Negative.   Gastrointestinal: Negative.   Musculoskeletal: Negative.   Neurological:  Positive for syncope. Negative for dizziness, tremors, seizures, facial asymmetry, speech difficulty, weakness, light-headedness, numbness and headaches.  Psychiatric/Behavioral: Negative.      Per HPI unless specifically indicated above     Objective:    BP 131/80   Pulse 71   Temp 98.8 F (37.1 C) (Oral)   Ht _1  (1.651 m)   Wt 185 lb 4.8 oz (84.1 kg)   SpO2 99%   BMI 30.84 kg/m   Wt Readings from Last 3 Encounters:  11/12/22 185 lb 4.8 oz (84.1 kg)  06/18/22 179 lb 4.8 oz (81.3 kg)  05/19/22 183 lb 3.2 oz (83.1 kg)    Physical Exam Vitals and  nursing note reviewed.  Constitutional:      General: She is not in acute distress.    Appearance: Normal appearance. She is not ill-appearing, toxic-appearing or diaphoretic.  HENT:     Head: Normocephalic and atraumatic.     Right Ear: External ear normal.     Left Ear: External ear normal.     Nose: Nose normal.     Mouth/Throat:     Mouth: Mucous membranes are moist.     Pharynx: Oropharynx is clear.  Eyes:     General: No scleral icterus.       Right eye: No discharge.        Left eye: No discharge.     Extraocular Movements: Extraocular movements intact.     Conjunctiva/sclera: Conjunctivae normal.     Pupils: Pupils are equal, round, and reactive to light.  Cardiovascular:     Rate and Rhythm: Normal rate and regular rhythm.     Pulses: Normal pulses.     Heart sounds: Normal heart sounds. No murmur heard.    No friction rub. No gallop.  Pulmonary:     Effort: Pulmonary effort is normal. No respiratory distress.     Breath sounds: Normal breath sounds.  No stridor. No wheezing, rhonchi or rales.  Chest:     Chest wall: No tenderness.  Musculoskeletal:        General: Normal range of motion.     Cervical back: Normal range of motion and neck supple.  Skin:    General: Skin is warm and dry.     Capillary Refill: Capillary refill takes less than 2 seconds.     Coloration: Skin is not jaundiced or pale.     Findings: No bruising, erythema, lesion or rash.  Neurological:     General: No focal deficit present.     Mental Status: She is alert and oriented to person, place, and time. Mental status is at baseline.     Cranial Nerves: No cranial nerve deficit.     Sensory: No sensory deficit.     Motor: No weakness.     Coordination: Coordination normal.     Gait: Gait normal.     Deep Tendon Reflexes: Reflexes normal.  Psychiatric:        Mood and Affect: Mood normal.        Behavior: Behavior normal.        Thought Content: Thought content normal.        Judgment:  Judgment normal.     Results for orders placed or performed in visit on 11/12/22  CBC with Differential/Platelet  Result Value Ref Range   WBC 6.5 3.4 - 10.8 x10E3/uL   RBC 3.95 3.77 - 5.28 x10E6/uL   Hemoglobin 11.0 (L) 11.1 - 15.9 g/dL   Hematocrit 33.3 (L) 34.0 - 46.6 %   MCV 84 79 - 97 fL   MCH 27.8 26.6 - 33.0 pg   MCHC 33.0 31.5 - 35.7 g/dL   RDW 12.7 11.7 - 15.4 %   Platelets 285 150 - 450 x10E3/uL   Neutrophils 63 Not Estab. %   Lymphs 27 Not Estab. %   Monocytes 8 Not Estab. %   Eos 1 Not Estab. %   Basos 1 Not Estab. %   Neutrophils Absolute 4.1 1.4 - 7.0 x10E3/uL   Lymphocytes Absolute 1.7 0.7 - 3.1 x10E3/uL   Monocytes Absolute 0.5 0.1 - 0.9 x10E3/uL   EOS (ABSOLUTE) 0.1 0.0 - 0.4 x10E3/uL   Basophils Absolute 0.0 0.0 - 0.2 x10E3/uL   Immature Granulocytes 0 Not Estab. %   Immature Grans (Abs) 0.0 0.0 - 0.1 O27O3/JK  Basic metabolic panel  Result Value Ref Range   Glucose 95 70 - 99 mg/dL   BUN 13 6 - 24 mg/dL   Creatinine, Ser 0.80 0.57 - 1.00 mg/dL   eGFR 88 >59 mL/min/1.73   BUN/Creatinine Ratio 16 9 - 23   Sodium 141 134 - 144 mmol/L   Potassium 3.9 3.5 - 5.2 mmol/L   Chloride 104 96 - 106 mmol/L   CO2 22 20 - 29 mmol/L   Calcium 9.7 8.7 - 10.2 mg/dL  Iron Binding Cap (TIBC)([facility])  Result Value Ref Range   Total Iron Binding Capacity 456 (H) 250 - 450 ug/dL   UIBC 432 (H) 131 - 425 ug/dL   Iron 24 (L) 27 - 159 ug/dL   Iron Saturation 5 (LL) 15 - 55 %  Ferritin  Result Value Ref Range   Ferritin 11 (L) 15 - 150 ng/mL      Assessment & Plan:   Problem List Items Addressed This Visit   None Visit Diagnoses     Syncope, unspecified syncope type    -  Primary   EKG normal today. Neuro exam normal. Will get her into cardiology for eval ASAP. MRI brain r/o stroke. Discussed not driving until evaluation is complete.   Relevant Orders   EKG 12-Lead (Completed)   CBC with Differential/Platelet (Completed)   Basic metabolic panel  (Completed)   Iron Binding Cap (TIBC)([facility]) (Completed)   Ferritin (Completed)   MR Brain Wo Contrast   Ambulatory referral to Cardiology        Follow up plan: Return if symptoms worsen or fail to improve.

## 2022-11-13 ENCOUNTER — Other Ambulatory Visit: Payer: Self-pay | Admitting: Family Medicine

## 2022-11-13 ENCOUNTER — Encounter: Payer: Self-pay | Admitting: Family Medicine

## 2022-11-13 ENCOUNTER — Ambulatory Visit: Payer: BC Managed Care – PPO | Admitting: Family Medicine

## 2022-11-13 LAB — CBC WITH DIFFERENTIAL/PLATELET
Basophils Absolute: 0 10*3/uL (ref 0.0–0.2)
Basos: 1 %
EOS (ABSOLUTE): 0.1 10*3/uL (ref 0.0–0.4)
Eos: 1 %
Hematocrit: 33.3 % — ABNORMAL LOW (ref 34.0–46.6)
Hemoglobin: 11 g/dL — ABNORMAL LOW (ref 11.1–15.9)
Immature Grans (Abs): 0 10*3/uL (ref 0.0–0.1)
Immature Granulocytes: 0 %
Lymphocytes Absolute: 1.7 10*3/uL (ref 0.7–3.1)
Lymphs: 27 %
MCH: 27.8 pg (ref 26.6–33.0)
MCHC: 33 g/dL (ref 31.5–35.7)
MCV: 84 fL (ref 79–97)
Monocytes Absolute: 0.5 10*3/uL (ref 0.1–0.9)
Monocytes: 8 %
Neutrophils Absolute: 4.1 10*3/uL (ref 1.4–7.0)
Neutrophils: 63 %
Platelets: 285 10*3/uL (ref 150–450)
RBC: 3.95 x10E6/uL (ref 3.77–5.28)
RDW: 12.7 % (ref 11.7–15.4)
WBC: 6.5 10*3/uL (ref 3.4–10.8)

## 2022-11-13 LAB — BASIC METABOLIC PANEL
BUN/Creatinine Ratio: 16 (ref 9–23)
BUN: 13 mg/dL (ref 6–24)
CO2: 22 mmol/L (ref 20–29)
Calcium: 9.7 mg/dL (ref 8.7–10.2)
Chloride: 104 mmol/L (ref 96–106)
Creatinine, Ser: 0.8 mg/dL (ref 0.57–1.00)
Glucose: 95 mg/dL (ref 70–99)
Potassium: 3.9 mmol/L (ref 3.5–5.2)
Sodium: 141 mmol/L (ref 134–144)
eGFR: 88 mL/min/{1.73_m2} (ref >59)

## 2022-11-13 LAB — IRON AND TIBC
Iron Saturation: 5 % — CL (ref 15–55)
Iron: 24 ug/dL — ABNORMAL LOW (ref 27–159)
Total Iron Binding Capacity: 456 ug/dL — ABNORMAL HIGH (ref 250–450)
UIBC: 432 ug/dL — ABNORMAL HIGH (ref 131–425)

## 2022-11-13 LAB — FERRITIN: Ferritin: 11 ng/mL — ABNORMAL LOW (ref 15–150)

## 2022-11-13 MED ORDER — IRON (FERROUS SULFATE) 325 (65 FE) MG PO TABS: 325.0000 mg | ORAL_TABLET | Freq: Every day | ORAL | 3 refills | Status: DC

## 2022-11-20 ENCOUNTER — Ambulatory Visit (INDEPENDENT_AMBULATORY_CARE_PROVIDER_SITE_OTHER): Payer: BC Managed Care – PPO | Admitting: Family Medicine

## 2022-11-20 ENCOUNTER — Encounter: Payer: Self-pay | Admitting: Family Medicine

## 2022-11-20 VITALS — BP 119/75 | HR 75 | Temp 97.8°F | Ht 65.0 in | Wt 181.4 lb

## 2022-11-20 DIAGNOSIS — R319 Hematuria, unspecified: Secondary | ICD-10-CM

## 2022-11-20 DIAGNOSIS — R55 Syncope and collapse: Secondary | ICD-10-CM

## 2022-11-20 DIAGNOSIS — Z1231 Encounter for screening mammogram for malignant neoplasm of breast: Secondary | ICD-10-CM | POA: Diagnosis not present

## 2022-11-20 DIAGNOSIS — Z0000 Encounter for general adult medical examination without abnormal findings: Secondary | ICD-10-CM

## 2022-11-20 DIAGNOSIS — N390 Urinary tract infection, site not specified: Secondary | ICD-10-CM

## 2022-11-20 LAB — MICROSCOPIC EXAMINATION: Bacteria, UA: NONE SEEN

## 2022-11-20 LAB — URINALYSIS, ROUTINE W REFLEX MICROSCOPIC
Bilirubin, UA: NEGATIVE
Glucose, UA: NEGATIVE
Ketones, UA: NEGATIVE
Leukocytes,UA: NEGATIVE
Nitrite, UA: NEGATIVE
Protein,UA: NEGATIVE
Specific Gravity, UA: 1.01 (ref 1.005–1.030)
Urobilinogen, Ur: 0.2 mg/dL (ref 0.2–1.0)
pH, UA: 5.5 (ref 5.0–7.5)

## 2022-11-20 NOTE — Progress Notes (Signed)
BP 119/75   Pulse 75   Temp 97.8 F (36.6 C) (Oral)   Ht _0  (1.651 m)   Wt 181 lb 6.4 oz (82.3 kg)   SpO2 98%   BMI 30.19 kg/m    Subjective:    Patient ID: Julie Patel, female    DOB: 1966/07/07, 56 y.o.   MRN: [id number]  HPI: Julie Patel is a 56 y.o. female presenting on 11/20/2022 for comprehensive medical examination. Current medical complaints include:  Has not called the cardiologist back. Has not gotten in for an appointment. Has not passed out again. No neurologic symptoms at this time. No weakness, no dizziness, no change in forgetfulness. No chest pain. Otherwise feeling well. She was concerned it was more due to low iron, but hemoglobin was 11.   She currently lives with: husband Menopausal Symptoms: no  Depression Screen done today and results listed below:     11/20/2022    3:16 PM 11/12/2022    4:23 PM 05/19/2022    3:21 PM 04/16/2022    8:35 AM 11/14/2021    3:15 PM  Depression screen PHQ 2/9  Decreased Interest 0 0 0 0 0  Down, Depressed, Hopeless 0 0 0 0 0  PHQ - 2 Score 0 0 0 0 0  Altered sleeping 0 0 0 0 0  Tired, decreased energy 0 1 0 0 0  Change in appetite 0 0 0 0 0  Feeling bad or failure about yourself  0 0 0 0 0  Trouble concentrating 0 0 0 0 0  Moving slowly or fidgety/restless 0 0 0 0 0  Suicidal thoughts 0 0 0 0 0  PHQ-9 Score 0 1 0 0 0  Difficult doing work/chores Not difficult at all Not difficult at all  Not difficult at all     Past Medical History:  Past Medical History:  Diagnosis Date   Abnormal uterine bleeding    Anemia    GERD (gastroesophageal reflux disease)    occ   Neck pain     Surgical History:  Past Surgical History:  Procedure Laterality Date   ABLATION     DILITATION & CURRETTAGE/HYSTROSCOPY WITH NOVASURE ABLATION N/A 03/08/2017   Procedure: DILATATION & CURETTAGE/HYSTEROSCOPY WITH NOVASURE ABLATION;  Surgeon: Brayton Mars, MD;  Location: [facility] ORS;  Service: Gynecology;  Laterality:  N/A;   WISDOM TOOTH EXTRACTION      Medications:  Current Outpatient Medications on File Prior to Visit  Medication Sig   Iron, Ferrous Sulfate, 325 (65 Fe) MG TABS Take 325 mg by mouth daily.   hydrocortisone (ANUSOL-HC) 2.5 % rectal cream Place 1 application  rectally 2 (two) times daily. (Patient not taking: Reported on 11/20/2022)   tretinoin (RETIN-A) 0.025 % gel Use a small layer topically as a spot treatment to affected areas of face nightly. (Patient not taking: Reported on 11/20/2022)   No current facility-administered medications on file prior to visit.    Allergies:  Allergies  Allergen Reactions   Codeine Nausea And Vomiting    Social History:  Social History   Socioeconomic History   Marital status: Married    Spouse name: Not on file   Number of children: Not on file   Years of education: Not on file   Highest education level: Not on file  Occupational History   Not on file  Tobacco Use   Smoking status: Never   Smokeless tobacco: Never  Vaping Use   Vaping Use: Never  used  Substance and Sexual Activity   Alcohol use: No   Drug use: No   Sexual activity: Not Currently  Other Topics Concern   Not on file  Social History Narrative   Not on file   Social Determinants of Health   Financial Resource Strain: Not on file  Food Insecurity: Not on file  Transportation Needs: Not on file  Physical Activity: Not on file  Stress: Not on file  Social Connections: Not on file  Intimate Partner Violence: Not on file   Social History   Tobacco Use  Smoking Status Never  Smokeless Tobacco Never   Social History   Substance and Sexual Activity  Alcohol Use No    Family History:  Family History  Problem Relation Age of Onset   Alzheimer's disease Mother    Cancer Father        Prostate   Hypertension Brother    Alzheimer's disease Maternal Grandmother    Cancer Maternal Grandfather        Lung   Diabetes Paternal Grandmother    Stroke Paternal  Grandfather    Breast cancer Neg Hx    Ovarian cancer Neg Hx    Colon cancer Neg Hx     Past medical history, surgical history, medications, allergies, family history and social history reviewed with patient today and changes made to appropriate areas of the chart.   Review of Systems  Constitutional:  Positive for malaise/fatigue. Negative for chills, diaphoresis, fever and weight loss.  HENT: Negative.    Eyes: Negative.   Respiratory: Negative.    Cardiovascular: Negative.   Gastrointestinal: Negative.   Genitourinary: Negative.   Musculoskeletal: Negative.   Skin: Negative.   Neurological: Negative.   Endo/Heme/Allergies: Negative.   Psychiatric/Behavioral: Negative.     All other ROS negative except what is listed above and in the HPI.      Objective:    BP 119/75   Pulse 75   Temp 97.8 F (36.6 C) (Oral)   Ht _0  (1.651 m)   Wt 181 lb 6.4 oz (82.3 kg)   SpO2 98%   BMI 30.19 kg/m   Wt Readings from Last 3 Encounters:  11/20/22 181 lb 6.4 oz (82.3 kg)  11/12/22 185 lb 4.8 oz (84.1 kg)  06/18/22 179 lb 4.8 oz (81.3 kg)    Physical Exam Vitals and nursing note reviewed.  Constitutional:      General: She is not in acute distress.    Appearance: Normal appearance. She is not ill-appearing, toxic-appearing or diaphoretic.  HENT:     Head: Normocephalic and atraumatic.     Right Ear: Tympanic membrane, ear canal and external ear normal. There is no impacted cerumen.     Left Ear: Tympanic membrane, ear canal and external ear normal. There is no impacted cerumen.     Nose: Nose normal. No congestion or rhinorrhea.     Mouth/Throat:     Mouth: Mucous membranes are moist.     Pharynx: Oropharynx is clear. No oropharyngeal exudate or posterior oropharyngeal erythema.  Eyes:     General: No scleral icterus.       Right eye: No discharge.        Left eye: No discharge.     Extraocular Movements: Extraocular movements intact.     Conjunctiva/sclera: Conjunctivae  normal.     Pupils: Pupils are equal, round, and reactive to light.  Neck:     Vascular: No carotid bruit.  Cardiovascular:  Rate and Rhythm: Normal rate and regular rhythm.     Pulses: Normal pulses.     Heart sounds: No murmur heard.    No friction rub. No gallop.  Pulmonary:     Effort: Pulmonary effort is normal. No respiratory distress.     Breath sounds: Normal breath sounds. No stridor. No wheezing, rhonchi or rales.  Chest:     Chest wall: No tenderness.  Abdominal:     General: Abdomen is flat. Bowel sounds are normal. There is no distension.     Palpations: Abdomen is soft. There is no mass.     Tenderness: There is no abdominal tenderness. There is no right CVA tenderness, left CVA tenderness, guarding or rebound.     Hernia: No hernia is present.  Genitourinary:    Comments: Breast and pelvic exams deferred with shared decision making Musculoskeletal:        General: No swelling, tenderness, deformity or signs of injury.     Cervical back: Normal range of motion and neck supple. No rigidity. No muscular tenderness.     Right lower leg: No edema.     Left lower leg: No edema.  Lymphadenopathy:     Cervical: No cervical adenopathy.  Skin:    General: Skin is warm and dry.     Capillary Refill: Capillary refill takes less than 2 seconds.     Coloration: Skin is not jaundiced or pale.     Findings: No bruising, erythema, lesion or rash.  Neurological:     General: No focal deficit present.     Mental Status: She is alert and oriented to person, place, and time. Mental status is at baseline.     Cranial Nerves: No cranial nerve deficit.     Sensory: No sensory deficit.     Motor: No weakness.     Coordination: Coordination normal.     Gait: Gait normal.     Deep Tendon Reflexes: Reflexes normal.  Psychiatric:        Mood and Affect: Mood normal.        Behavior: Behavior normal.        Thought Content: Thought content normal.        Judgment: Judgment normal.      Results for orders placed or performed in visit on 11/12/22  CBC with Differential/Platelet  Result Value Ref Range   WBC 6.5 3.4 - 10.8 x10E3/uL   RBC 3.95 3.77 - 5.28 x10E6/uL   Hemoglobin 11.0 (L) 11.1 - 15.9 g/dL   Hematocrit 33.3 (L) 34.0 - 46.6 %   MCV 84 79 - 97 fL   MCH 27.8 26.6 - 33.0 pg   MCHC 33.0 31.5 - 35.7 g/dL   RDW 12.7 11.7 - 15.4 %   Platelets 285 150 - 450 x10E3/uL   Neutrophils 63 Not Estab. %   Lymphs 27 Not Estab. %   Monocytes 8 Not Estab. %   Eos 1 Not Estab. %   Basos 1 Not Estab. %   Neutrophils Absolute 4.1 1.4 - 7.0 x10E3/uL   Lymphocytes Absolute 1.7 0.7 - 3.1 x10E3/uL   Monocytes Absolute 0.5 0.1 - 0.9 x10E3/uL   EOS (ABSOLUTE) 0.1 0.0 - 0.4 x10E3/uL   Basophils Absolute 0.0 0.0 - 0.2 x10E3/uL   Immature Granulocytes 0 Not Estab. %   Immature Grans (Abs) 0.0 0.0 - 0.1 F57D2/KG  Basic metabolic panel  Result Value Ref Range   Glucose 95 70 - 99 mg/dL   BUN 13  6 - 24 mg/dL   Creatinine, Ser 0.80 0.57 - 1.00 mg/dL   eGFR 88 >59 mL/min/1.73   BUN/Creatinine Ratio 16 9 - 23   Sodium 141 134 - 144 mmol/L   Potassium 3.9 3.5 - 5.2 mmol/L   Chloride 104 96 - 106 mmol/L   CO2 22 20 - 29 mmol/L   Calcium 9.7 8.7 - 10.2 mg/dL  Iron Binding Cap (TIBC)([facility])  Result Value Ref Range   Total Iron Binding Capacity 456 (H) 250 - 450 ug/dL   UIBC 432 (H) 131 - 425 ug/dL   Iron 24 (L) 27 - 159 ug/dL   Iron Saturation 5 (LL) 15 - 55 %  Ferritin  Result Value Ref Range   Ferritin 11 (L) 15 - 150 ng/mL      Assessment & Plan:   Problem List Items Addressed This Visit   None Visit Diagnoses     Routine general medical examination at a health care facility    -  Primary   Vaccines up to date/declined. Screening labs checked today. Pap up to date. Mammo and colonoscopies ordered. Continue diet and exercise. Call with any concerns.   Relevant Orders   CBC with Differential/Platelet   Comprehensive metabolic panel   Lipid Panel w/o  Chol/HDL Ratio   Urinalysis, Routine w reflex microscopic   TSH   Encounter for screening mammogram for malignant neoplasm of breast       Mammogram ordered.   Relevant Orders   MM 3D SCREEN BREAST BILATERAL   Syncope, unspecified syncope type       Reached out to cardiology to get her appointment scheduled. Await results.        Follow up plan: Return in about 1 year (around 11/21/2023) for physical unless not better.   LABORATORY TESTING:  - Pap smear: up to date  IMMUNIZATIONS:   - Tdap: Tetanus vaccination status reviewed: last tetanus booster within 10 years. - Influenza: Up to date - Pneumovax: Not applicable - Prevnar: Not applicable - COVID: Refused - HPV: Not applicable - Shingrix vaccine: Refused  SCREENING: -Mammogram: Ordered today  - Colonoscopy: scheduled   PATIENT COUNSELING:   Advised to take 1 mg of folate supplement per day if capable of pregnancy.   Sexuality: Discussed sexually transmitted diseases, partner selection, use of condoms, avoidance of unintended pregnancy  and contraceptive alternatives.   Advised to avoid cigarette smoking.  I discussed with the patient that most people either abstain from alcohol or drink within safe limits (<=14/week and <=4 drinks/occasion for males, <=7/weeks and <= 3 drinks/occasion for females) and that the risk for alcohol disorders and other health effects rises proportionally with the number of drinks per week and how often a drinker exceeds daily limits.  Discussed cessation/primary prevention of drug use and availability of treatment for abuse.   Diet: Encouraged to adjust caloric intake to maintain  or achieve ideal body weight, to reduce intake of dietary saturated fat and total fat, to limit sodium intake by avoiding high sodium foods and not adding table salt, and to maintain adequate dietary potassium and calcium preferably from fresh fruits, vegetables, and low-fat dairy products.    stressed the  importance of regular exercise  Injury prevention: Discussed safety belts, safety helmets, smoke detector, smoking near bedding or upholstery.   Dental health: Discussed importance of regular tooth brushing, flossing, and dental visits.    NEXT PREVENTATIVE PHYSICAL DUE IN 1 YEAR. Return in about 1 year (around 11/21/2023) for physical  unless not better.

## 2022-11-20 NOTE — Patient Instructions (Addendum)
Please call to schedule your mammogram and/or bone density: [facility] at [place]: [address], [place], [place] [address] Phone: [phone]  [place] [facility] [address] [place],  [place]  [address] Phone: [phone]    Patient is scheduled with [facility] Cardiologist on December 18th at 2:00 PM with arrival of 1:45 PM. Patient is to bring current medication list and insurance cards to her appointment.

## 2022-11-21 LAB — TSH: TSH: 1.97 u[IU]/mL (ref 0.450–4.500)

## 2022-11-21 LAB — COMPREHENSIVE METABOLIC PANEL
ALT: 17 IU/L (ref 0–32)
AST: 22 IU/L (ref 0–40)
Albumin/Globulin Ratio: 1.4 (ref 1.2–2.2)
Albumin: 4.3 g/dL (ref 3.8–4.9)
Alkaline Phosphatase: 81 IU/L (ref 44–121)
BUN/Creatinine Ratio: 16 (ref 9–23)
BUN: 14 mg/dL (ref 6–24)
Bilirubin Total: 0.6 mg/dL (ref 0.0–1.2)
CO2: 21 mmol/L (ref 20–29)
Calcium: 9.3 mg/dL (ref 8.7–10.2)
Chloride: 99 mmol/L (ref 96–106)
Creatinine, Ser: 0.9 mg/dL (ref 0.57–1.00)
Globulin, Total: 3 g/dL (ref 1.5–4.5)
Glucose: 85 mg/dL (ref 70–99)
Potassium: 4.3 mmol/L (ref 3.5–5.2)
Sodium: 138 mmol/L (ref 134–144)
Total Protein: 7.3 g/dL (ref 6.0–8.5)
eGFR: 76 mL/min/{1.73_m2} (ref >59)

## 2022-11-21 LAB — CBC WITH DIFFERENTIAL/PLATELET
Basophils Absolute: 0 10*3/uL (ref 0.0–0.2)
Basos: 1 %
EOS (ABSOLUTE): 0.1 10*3/uL (ref 0.0–0.4)
Eos: 2 %
Hematocrit: 34.1 % (ref 34.0–46.6)
Hemoglobin: 11.2 g/dL (ref 11.1–15.9)
Immature Grans (Abs): 0 10*3/uL (ref 0.0–0.1)
Immature Granulocytes: 0 %
Lymphocytes Absolute: 1.7 10*3/uL (ref 0.7–3.1)
Lymphs: 25 %
MCH: 27.4 pg (ref 26.6–33.0)
MCHC: 32.8 g/dL (ref 31.5–35.7)
MCV: 83 fL (ref 79–97)
Monocytes Absolute: 0.5 10*3/uL (ref 0.1–0.9)
Monocytes: 8 %
Neutrophils Absolute: 4.4 10*3/uL (ref 1.4–7.0)
Neutrophils: 64 %
Platelets: 282 10*3/uL (ref 150–450)
RBC: 4.09 x10E6/uL (ref 3.77–5.28)
RDW: 12.2 % (ref 11.7–15.4)
WBC: 6.8 10*3/uL (ref 3.4–10.8)

## 2022-11-21 LAB — LIPID PANEL W/O CHOL/HDL RATIO
Cholesterol, Total: 324 mg/dL — ABNORMAL HIGH (ref 100–199)
HDL: 52 mg/dL (ref >39)
LDL Chol Calc (NIH): 215 mg/dL — ABNORMAL HIGH (ref 0–99)
Triglycerides: 281 mg/dL — ABNORMAL HIGH (ref 0–149)
VLDL Cholesterol Cal: 57 mg/dL — ABNORMAL HIGH (ref 5–40)

## 2022-11-23 ENCOUNTER — Encounter: Payer: Self-pay | Admitting: Cardiology

## 2022-11-23 ENCOUNTER — Ambulatory Visit (INDEPENDENT_AMBULATORY_CARE_PROVIDER_SITE_OTHER): Payer: BC Managed Care – PPO

## 2022-11-23 ENCOUNTER — Ambulatory Visit: Admission: RE | Admit: 2022-11-23 | Payer: BC Managed Care – PPO | Source: Ambulatory Visit

## 2022-11-23 ENCOUNTER — Encounter: Payer: Self-pay | Admitting: Family Medicine

## 2022-11-23 ENCOUNTER — Ambulatory Visit: Payer: BC Managed Care – PPO | Attending: Cardiology | Admitting: Cardiology

## 2022-11-23 VITALS — BP 149/80 | HR 79 | Ht 64.0 in | Wt 182.8 lb

## 2022-11-23 DIAGNOSIS — E782 Mixed hyperlipidemia: Secondary | ICD-10-CM | POA: Diagnosis not present

## 2022-11-23 DIAGNOSIS — R55 Syncope and collapse: Secondary | ICD-10-CM

## 2022-11-23 NOTE — Addendum Note (Signed)
Addended by: Irena Reichmann on: 11/23/2022 03:40 PM   Modules accepted: Orders

## 2022-11-23 NOTE — Patient Instructions (Signed)
Medication Instructions:   Your physician recommends that you continue on your current medications as directed. Please refer to the Current Medication list given to you today.  *If you need a refill on your cardiac medications before your next appointment, please call your pharmacy*   Lab Work:  Your physician recommends that you return for lab work in:2 months at the medical mall. You will need to be fasting.  No appt is needed. Hours are M-F 7AM- 6 PM.  If you have labs (blood work) drawn today and your tests are completely normal, you will receive your results only by: [facility] (if you have MyChart) OR A paper copy in the mail If you have any lab test that is abnormal or we need to change your treatment, we will call you to review the results.   Testing/Procedures:  Echocardiogram   Your physician has requested that you have an echocardiogram. Echocardiography is a painless test that uses sound waves to create images of your heart. It provides your doctor with information about the size and shape of your heart and how well your heart's chambers and valves are working. This procedure takes approximately one hour. There are no restrictions for this procedure. Please note; depending on visual quality an IV may need to be placed.   2. Your physician has recommended that you wear a Zio monitor.   This monitor is a medical device that records the heart's electrical activity. Doctors most often use these monitors to diagnose arrhythmias. Arrhythmias are problems with the speed or rhythm of the heartbeat. The monitor is a small device applied to your chest. You can wear one while you do your normal daily activities. While wearing this monitor if you have any symptoms to push the button and record what you felt. Once you have worn this monitor for the period of time provider prescribed (Usually 14 days), you will return the monitor device in the postage paid box. Once it is returned they  will download the data collected and provide [place] with a report which the provider will then review and we will call you with those results. Important tips:  Avoid showering during the first 24 hours of wearing the monitor. Avoid excessive sweating to help maximize wear time. Do not submerge the device, no hot tubs, and no swimming pools. Keep any lotions or oils away from the patch. After 24 hours you may shower with the patch on. Take brief showers with your back facing the shower head.  Do not remove patch once it has been placed because that will interrupt data and decrease adhesive wear time. Push the button when you have any symptoms and write down what you were feeling. Once you have completed wearing your monitor, remove and place into box which has postage paid and place in your outgoing mailbox.  If for some reason you have misplaced your box then call our office and we can provide another box and/or mail it off for you.      Follow-Up: At [facility], you and your health needs are our priority.  As part of our continuing mission to provide you with exceptional heart care, we have created designated Provider Care Teams.  These Care Teams include your primary Cardiologist (physician) and Advanced Practice Providers (APPs -  Physician Assistants and Nurse Practitioners) who all work together to provide you with the care you need, when you need it.  We recommend signing up for the patient portal called "MyChart".  Sign up information is provided on this After Visit Summary.  MyChart is used to connect with patients for Virtual Visits (Telemedicine).  Patients are able to view lab/test results, encounter notes, upcoming appointments, etc.  Non-urgent messages can be sent to your provider as well.   To learn more about what you can do with MyChart, go to [email or web address].    Your next appointment:   2 month(s)  The format for your next appointment:   In  Person  Provider:   You may see Kate Sable, MD or one of the following Advanced Practice Providers on your designated Care Team:   Murray Hodgkins, NP Christell Faith, PA-C Cadence Kathlen Mody, PA-C Gerrie Nordmann, NP

## 2022-11-23 NOTE — Progress Notes (Signed)
Cardiology Office Note:    Date:  11/23/2022   ID:  Julie Patel, DOB 06/21/1966, MRN [id number]  PCP:  Valerie Roys, DO   [facility] Cardiologist:  Kate Sable, MD     Referring MD: Valerie Roys, DO   Chief Complaint  Patient presents with   New Patient (Initial Visit)    Syncope(11-15-22) one event of collapse   Julie Patel is a 56 y.o. female who is being seen today for the evaluation of syncope at the request of Valerie Roys, DO.   History of Present Illness:    Julie Patel is a 56 y.o. female with a hx of GERD who presents due to syncope.  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  Woke up few minutes later, they travel she was administering test due to her she was asleep.  She denies chest pain, dizziness, palpitations, diarrhea or shortness of breath.  Denies any prior passing out episodes, has not had any episodes since.  Follow-up with primary care provider, was diagnosed with iron deficiency.  Started on iron tablets.  She feels well otherwise, denies any history of heart disease.  Recent cholesterol check was abnormal.  Past Medical History:  Diagnosis Date   Abnormal uterine bleeding    Anemia    GERD (gastroesophageal reflux disease)    occ   Neck pain     Past Surgical History:  Procedure Laterality Date   ABLATION     DILITATION & CURRETTAGE/HYSTROSCOPY WITH NOVASURE ABLATION N/A 03/08/2017   Procedure: DILATATION & CURETTAGE/HYSTEROSCOPY WITH NOVASURE ABLATION;  Surgeon: Brayton Mars, MD;  Location: [facility] ORS;  Service: Gynecology;  Laterality: N/A;   WISDOM TOOTH EXTRACTION      Current Medications: Current Meds  Medication Sig   Iron, Ferrous Sulfate, 325 (65 Fe) MG TABS Take 325 mg by mouth daily.   multivitamin-iron-minerals-folic acid (CENTRUM) chewable tablet Chew 1 tablet by mouth daily.   tretinoin  (RETIN-A) 0.025 % gel Use a small layer topically as a spot treatment to affected areas of face nightly. (Patient taking differently: 0.025 % daily as needed. Use a small layer topically as a spot treatment to affected areas of face nightly.)     Allergies:   Codeine   Social History   Socioeconomic History   Marital status: Married    Spouse name: Not on file   Number of children: Not on file   Years of education: Not on file   Highest education level: Not on file  Occupational History   Not on file  Tobacco Use   Smoking status: Never    Passive exposure: Past   Smokeless tobacco: Never  Vaping Use   Vaping Use: Never used  Substance and Sexual Activity   Alcohol use: No   Drug use: No   Sexual activity: Not Currently  Other Topics Concern   Not on file  Social History Narrative   Not on file   Social Determinants of Health   Financial Resource Strain: Not on file  Food Insecurity: Not on file  Transportation Needs: Not on file  Physical Activity: Not on file  Stress: Not on file  Social Connections: Not on file     Family History: The patient's family history includes Alzheimer's disease in her maternal grandmother and mother; Cancer in her father and maternal grandfather; Diabetes in her paternal grandmother; Hypertension  in her brother; Stroke in her paternal grandfather. There is no history of Breast cancer, Ovarian cancer, or Colon cancer.  ROS:   Please see the history of present illness.     All other systems reviewed and are negative.  EKGs/Labs/Other Studies Reviewed:    The following studies were reviewed today:   EKG:  EKG is  ordered today.  The ekg ordered today demonstrates normal sinus rhythm  Recent Labs: 11/20/2022: ALT 17; BUN 14; Creatinine, Ser 0.90; Hemoglobin 11.2; Platelets 282; Potassium 4.3; Sodium 138; TSH 1.970  Recent Lipid Panel    Component Value Date/Time   CHOL 324 (H) 11/20/2022 1553   TRIG 281 (H) 11/20/2022 1553   HDL  52 11/20/2022 1553   LDLCALC 215 (H) 11/20/2022 1553     Risk Assessment/Calculations:      HYPERTENSION CONTROL Vitals:   11/23/22 1411 11/23/22 1419  BP: 126/82 (!) 149/80    The patient's blood pressure is elevated above target today.  In order to address the patient's elevated BP: Blood pressure will be monitored at home to determine if medication changes need to be made.            Physical Exam:    VS:  BP (!) 149/80 (BP Location: Left Arm, Patient Position: Sitting)   Pulse 79   Ht '5\' 4"'$  (1.626 m)   Wt 182 lb 12.8 oz (82.9 kg)   SpO2 98%   BMI 31.38 kg/m     Wt Readings from Last 3 Encounters:  11/23/22 182 lb 12.8 oz (82.9 kg)  11/20/22 181 lb 6.4 oz (82.3 kg)  11/12/22 185 lb 4.8 oz (84.1 kg)     GEN:  Well nourished, well developed in no acute distress HEENT: Normal NECK: No JVD; No carotid bruits CARDIAC: RRR, no murmurs, rubs, gallops RESPIRATORY:  Clear to auscultation without rales, wheezing or rhonchi  ABDOMEN: Soft, non-tender, non-distended MUSCULOSKELETAL:  No edema; No deformity  SKIN: Warm and dry NEUROLOGIC:  Alert and oriented x 3 PSYCHIATRIC:  Normal affect   ASSESSMENT:    1. Syncope and collapse   2. Mixed hyperlipidemia    PLAN:    In order of problems listed above:  Syncope, etiology unclear.  Denies cardiac symptoms.  Could be vasovagal.  Has not had any episodes since.  Denies any prior episodes.  Obtain echo, place cardiac monitor to rule out any cardiac etiology.  Okay to withhold driving for now pending cardiac workup.  Has not had any episodes since.  Orthostatic vitals with no evidence of orthostasis. Hyperlipidemia, LDL 215 suggesting FH.  Statin therapy recommended, patient would like to try diet.  Repeat lipid panel in 2 months.  Low-cholesterol diet advised.  Follow-up in 2 months after cardiac testing.       Medication Adjustments/Labs and Tests Ordered: Current medicines are reviewed at length with the patient  today.  Concerns regarding medicines are outlined above.  Orders Placed This Encounter  Procedures   Lipid panel   LONG TERM MONITOR (3-14 DAYS)   EKG 12-Lead   ECHOCARDIOGRAM COMPLETE   No orders of the defined types were placed in this encounter.   Patient Instructions  Medication Instructions:   Your physician recommends that you continue on your current medications as directed. Please refer to the Current Medication list given to you today.  *If you need a refill on your cardiac medications before your next appointment, please call your pharmacy*   Lab Work:  Your physician recommends that  you return for lab work in:2 months at the medical mall. You will need to be fasting.  No appt is needed. Hours are M-F 7AM- 6 PM.  If you have labs (blood work) drawn today and your tests are completely normal, you will receive your results only by: [place] (if you have MyChart) OR A paper copy in the mail If you have any lab test that is abnormal or we need to change your treatment, we will call you to review the results.   Testing/Procedures:  Echocardiogram   Your physician has requested that you have an echocardiogram. Echocardiography is a painless test that uses sound waves to create images of your heart. It provides your doctor with information about the size and shape of your heart and how well your heart's chambers and valves are working. This procedure takes approximately one hour. There are no restrictions for this procedure. Please note; depending on visual quality an IV may need to be placed.   2. Your physician has recommended that you wear a Zio monitor.   This monitor is a medical device that records the heart's electrical activity. Doctors most often use these monitors to diagnose arrhythmias. Arrhythmias are problems with the speed or rhythm of the heartbeat. The monitor is a small device applied to your chest. You can wear one while you do your normal daily  activities. While wearing this monitor if you have any symptoms to push the button and record what you felt. Once you have worn this monitor for the period of time provider prescribed (Usually 14 days), you will return the monitor device in the postage paid box. Once it is returned they will download the data collected and provide [place] with a report which the provider will then review and we will call you with those results. Important tips:  Avoid showering during the first 24 hours of wearing the monitor. Avoid excessive sweating to help maximize wear time. Do not submerge the device, no hot tubs, and no swimming pools. Keep any lotions or oils away from the patch. After 24 hours you may shower with the patch on. Take brief showers with your back facing the shower head.  Do not remove patch once it has been placed because that will interrupt data and decrease adhesive wear time. Push the button when you have any symptoms and write down what you were feeling. Once you have completed wearing your monitor, remove and place into box which has postage paid and place in your outgoing mailbox.  If for some reason you have misplaced your box then call our office and we can provide another box and/or mail it off for you.      Follow-Up: At [facility], you and your health needs are our priority.  As part of our continuing mission to provide you with exceptional heart care, we have created designated Provider Care Teams.  These Care Teams include your primary Cardiologist (physician) and Advanced Practice Providers (APPs -  Physician Assistants and Nurse Practitioners) who all work together to provide you with the care you need, when you need it.  We recommend signing up for the patient portal called "MyChart".  Sign up information is provided on this After Visit Summary.  MyChart is used to connect with patients for Virtual Visits (Telemedicine).  Patients are able to view lab/test results,  encounter notes, upcoming appointments, etc.  Non-urgent messages can be sent to your provider as well.   To learn more about what  you can do with MyChart, go to [email or web address].    Your next appointment:   2 month(s)  The format for your next appointment:   In Person  Provider:   You may see Kate Sable, MD or one of the following Advanced Practice Providers on your designated Care Team:   Murray Hodgkins, NP Christell Faith, PA-C Cadence Kathlen Mody, PA-C Gerrie Nordmann, NP       Signed, Kate Sable, MD  11/23/2022 3:25 PM    [place]

## 2022-11-24 ENCOUNTER — Other Ambulatory Visit: Payer: Self-pay | Admitting: Family Medicine

## 2022-11-24 LAB — URINALYSIS, ROUTINE W REFLEX MICROSCOPIC
Bilirubin, UA: NEGATIVE
Glucose, UA: NEGATIVE
Ketones, UA: NEGATIVE
Nitrite, UA: NEGATIVE
Specific Gravity, UA: 1.025 (ref 1.005–1.030)
Urobilinogen, Ur: 0.2 mg/dL (ref 0.2–1.0)
pH, UA: 5.5 (ref 5.0–7.5)

## 2022-11-24 LAB — MICROSCOPIC EXAMINATION: Bacteria, UA: NONE SEEN

## 2022-11-24 MED ORDER — NITROFURANTOIN MONOHYD MACRO 100 MG PO CAPS: 100.0000 mg | ORAL_CAPSULE | Freq: Two times a day (BID) | ORAL | 0 refills | Status: DC

## 2022-11-27 NOTE — Progress Notes (Signed)
Interpreted by me 11/12/22. NSR 70bpm, No ST segment changes

## 2022-12-22 DIAGNOSIS — R55 Syncope and collapse: Secondary | ICD-10-CM

## 2023-01-13 ENCOUNTER — Telehealth: Payer: Self-pay | Admitting: Cardiology

## 2023-01-13 DIAGNOSIS — R55 Syncope and collapse: Secondary | ICD-10-CM

## 2023-01-13 NOTE — Telephone Encounter (Signed)
Left voicemail message to call back for review of recommendations.    Verbally reviewed with Dr. Garen Lah and recommendations were to enter referral with Dr. Quentin Ore and further instructions to proceed to [facility] ED.

## 2023-01-13 NOTE — Telephone Encounter (Signed)
Call received from [facility] with critical results.      Cardiac Monitor Alert  Date of alert:  01/13/2023   Patient Name: Julie Patel  DOB: 30-Oct-1966  MRN: [id number]   [place] Cardiologist: Kate Sable, MD  [facility] EP:  None    Monitor Information: Long Term Monitor [ZioXT]  Reason:  Pauses from 7.4 to 17.7 second pauses of Ventricular asystole d/t possible high degree AV Block. Ordering provider:  Dr. Garen Lah  {  Alert Pauses Ventricular Asystole due to possible high degree AV Block This is the 1st alert for this rhythm.   Next Cardiology Appointment   Date:  01/26/2023  Provider:  Dr. Bertram Denver, Ivan Anchors, RN  01/13/2023 2:00 PM

## 2023-01-13 NOTE — Telephone Encounter (Signed)
Julie Patel calling with abnormal zio monitor results

## 2023-01-14 NOTE — Telephone Encounter (Signed)
Left voicemail message to call back  

## 2023-01-14 NOTE — Telephone Encounter (Signed)
Called spouses number and there was no answer/no voicemail.

## 2023-01-14 NOTE — Telephone Encounter (Signed)
Left voicemail message on son's number per release form to have them give [place] a call back.

## 2023-01-15 ENCOUNTER — Encounter: Payer: Self-pay | Admitting: Family Medicine

## 2023-01-15 ENCOUNTER — Encounter (HOSPITAL_COMMUNITY): Payer: Self-pay

## 2023-01-15 ENCOUNTER — Other Ambulatory Visit: Payer: Self-pay

## 2023-01-15 ENCOUNTER — Ambulatory Visit: Payer: BC Managed Care – PPO | Admitting: Family Medicine

## 2023-01-15 ENCOUNTER — Other Ambulatory Visit: Payer: Self-pay | Admitting: Physician Assistant

## 2023-01-15 ENCOUNTER — Emergency Department (HOSPITAL_COMMUNITY)
Admission: EM | Admit: 2023-01-15 | Discharge: 2023-01-15 | Disposition: A | Discharge status: Home / Self Care | Payer: BC Managed Care – PPO | Attending: Emergency Medicine | Admitting: Emergency Medicine

## 2023-01-15 VITALS — BP 136/81 | HR 81 | Temp 98.6°F | Wt 183.0 lb

## 2023-01-15 DIAGNOSIS — I455 Other specified heart block: Secondary | ICD-10-CM

## 2023-01-15 DIAGNOSIS — R9431 Abnormal electrocardiogram [ECG] [EKG]: Secondary | ICD-10-CM | POA: Insufficient documentation

## 2023-01-15 DIAGNOSIS — R400 Somnolence: Secondary | ICD-10-CM

## 2023-01-15 DIAGNOSIS — I499 Cardiac arrhythmia, unspecified: Secondary | ICD-10-CM

## 2023-01-15 DIAGNOSIS — R0683 Snoring: Secondary | ICD-10-CM

## 2023-01-15 LAB — CBC WITH DIFFERENTIAL/PLATELET
Abs Immature Granulocytes: 0.01 10*3/uL (ref 0.00–0.07)
Basophils Absolute: 0 10*3/uL (ref 0.0–0.1)
Basophils Relative: 0 %
Eosinophils Absolute: 0.1 10*3/uL (ref 0.0–0.5)
Eosinophils Relative: 2 %
HCT: 38.5 % (ref 36.0–46.0)
Hemoglobin: 12.3 g/dL (ref 12.0–15.0)
Immature Granulocytes: 0 %
Lymphocytes Relative: 26 %
Lymphs Abs: 1.2 10*3/uL (ref 0.7–4.0)
MCH: 27.7 pg (ref 26.0–34.0)
MCHC: 31.9 g/dL (ref 30.0–36.0)
MCV: 86.7 fL (ref 80.0–100.0)
Monocytes Absolute: 0.6 10*3/uL (ref 0.1–1.0)
Monocytes Relative: 13 %
Neutro Abs: 2.7 10*3/uL (ref 1.7–7.7)
Neutrophils Relative %: 59 %
Platelets: 252 10*3/uL (ref 150–400)
RBC: 4.44 MIL/uL (ref 3.87–5.11)
RDW: 14.2 % (ref 11.5–15.5)
WBC: 4.6 10*3/uL (ref 4.0–10.5)
nRBC: 0 % (ref 0.0–0.2)

## 2023-01-15 LAB — COMPREHENSIVE METABOLIC PANEL
ALT: 19 U/L (ref 0–44)
AST: 24 U/L (ref 15–41)
Albumin: 3.7 g/dL (ref 3.5–5.0)
Alkaline Phosphatase: 65 U/L (ref 38–126)
Anion gap: 10 (ref 5–15)
BUN: 11 mg/dL (ref 6–20)
CO2: 24 mmol/L (ref 22–32)
Calcium: 9.2 mg/dL (ref 8.9–10.3)
Chloride: 102 mmol/L (ref 98–111)
Creatinine, Ser: 0.93 mg/dL (ref 0.44–1.00)
GFR, Estimated: >60 mL/min (ref >60)
Glucose, Bld: 91 mg/dL (ref 70–99)
Potassium: 4.6 mmol/L (ref 3.5–5.1)
Sodium: 136 mmol/L (ref 135–145)
Total Bilirubin: 1.1 mg/dL (ref 0.3–1.2)
Total Protein: 7 g/dL (ref 6.5–8.1)

## 2023-01-15 LAB — MAGNESIUM: Magnesium: 2.4 mg/dL (ref 1.7–2.4)

## 2023-01-15 NOTE — ED Notes (Signed)
AVS reviewed with pt and husband prior to discharge. Pt verbalizes understanding. Belongings with pt upon depart. Ambulatory to POV with husband.

## 2023-01-15 NOTE — Telephone Encounter (Signed)
Called Trish with [facility] to make her aware patient is coming to ER for pauses noted on heart monitor.    Spoke with husband to let him know they are aware of her coming. He verbalized understanding

## 2023-01-15 NOTE — ED Provider Triage Note (Addendum)
Emergency Medicine Provider Triage Evaluation Note  Shawnia Wunderlin Tamez , a 57 y.o. female  was evaluated in triage.  Pt complains of abnormal EKG.  Patient reports that she had a long-term monitor placed by cardiology which normal several abnormal findings.  Based on the report, patient had periods of sinus pauses lasting 14 to 17 seconds and was concerning for possible high-grade AV block.  Patient's cardiologist wanted patient to come to the emergency department for further evaluation possible consideration for pacemaker placement.  No prior history of heart disease.  Review of Systems  Positive: As above Negative: As above  Physical Exam  BP (!) 143/98 (BP Location: Right Arm)   Pulse 78   Temp 98.2 F (36.8 C) (Oral)   Resp 16   Ht 5' 4"$  (1.626 m)   Wt 83 kg   SpO2 97%   BMI 31.41 kg/m  Gen:   Awake, no distress   Resp:  Normal effort  MSK:   Moves extremities without difficulty  Other:    Medical Decision Making  Medically screening exam initiated at 12:21 PM.  Appropriate orders placed.  S[place] S [place] was informed that the remainder of the evaluation will be completed by another provider, this initial triage assessment does not replace that evaluation, and the importance of remaining in the ED until their evaluation is complete.     Luvenia Heller, PA-C 01/15/23 1222    Luvenia Heller, PA-C 01/15/23 1223

## 2023-01-15 NOTE — Telephone Encounter (Signed)
Spoke with patient and reviewed results and recommendations from her monitor. Provider requested that she go to [facility] ED in [place] for further evaluation and possible pacemaker. She was very tearful and provided emotional support and reassurance. She verbalized understanding and requested that I please give her husband a call at [phone]. She was appreciative for the call with no further questions at this time.

## 2023-01-15 NOTE — Progress Notes (Signed)
4  BP 136/81   Pulse 81   Temp 98.6 F (37 C) (Oral)   Wt 183 lb (83 kg)   SpO2 98%   BMI 31.41 kg/m    Subjective:    Patient ID: Julie Patel, female    DOB: 18-May-1966, 57 y.o.   MRN: [id number]  HPI: Julie Patel is a 57 y.o. female  Chief Complaint  Patient presents with   Heart Problem   Julie Patel presents today with her husband. She got a call this morning from her cardiologist stating that there was a ? Of a high degree AV block on her EKG and that she should go to the ER for evaluation for a pacemaker. She didn't believe that this was real and so she came here for a "2nd opinion". She notes that she is feeling fine. She has had no concerns. No issues with passing out since the first time. She is very anxious about the possibility of having a pacemaker. No other concerns or complaints at this time.   Relevant past medical, surgical, family and social history reviewed and updated as indicated. Interim medical history since our last visit reviewed. Allergies and medications reviewed and updated.  Review of Systems  Constitutional: Negative.   Respiratory: Negative.    Cardiovascular: Negative.   Gastrointestinal: Negative.   Musculoskeletal: Negative.   Psychiatric/Behavioral:  Negative for agitation, behavioral problems, confusion, decreased concentration, dysphoric mood, hallucinations, self-injury, sleep disturbance and suicidal ideas. The patient is nervous/anxious. The patient is not hyperactive.     Per HPI unless specifically indicated above     Objective:    BP 136/81   Pulse 81   Temp 98.6 F (37 C) (Oral)   Wt 183 lb (83 kg)   SpO2 98%   BMI 31.41 kg/m   Wt Readings from Last 3 Encounters:  01/15/23 183 lb (83 kg)  01/15/23 183 lb (83 kg)  11/23/22 182 lb 12.8 oz (82.9 kg)    Physical Exam Vitals and nursing note reviewed.  Constitutional:      General: She is not in acute distress.    Appearance: Normal appearance. She is not  ill-appearing, toxic-appearing or diaphoretic.  HENT:     Head: Normocephalic and atraumatic.     Right Ear: External ear normal.     Left Ear: External ear normal.     Nose: Nose normal.     Mouth/Throat:     Mouth: Mucous membranes are moist.     Pharynx: Oropharynx is clear.  Eyes:     General: No scleral icterus.       Right eye: No discharge.        Left eye: No discharge.     Extraocular Movements: Extraocular movements intact.     Conjunctiva/sclera: Conjunctivae normal.     Pupils: Pupils are equal, round, and reactive to light.  Cardiovascular:     Rate and Rhythm: Normal rate and regular rhythm.     Pulses: Normal pulses.     Heart sounds: Normal heart sounds. No murmur heard.    No friction rub. No gallop.  Pulmonary:     Effort: Pulmonary effort is normal. No respiratory distress.     Breath sounds: Normal breath sounds. No stridor. No wheezing, rhonchi or rales.  Chest:     Chest wall: No tenderness.  Musculoskeletal:        General: Normal range of motion.     Cervical back: Normal range of motion and neck supple.  Skin:    General: Skin is warm and dry.     Capillary Refill: Capillary refill takes less than 2 seconds.     Coloration: Skin is not jaundiced or pale.     Findings: No bruising, erythema, lesion or rash.  Neurological:     General: No focal deficit present.     Mental Status: She is alert and oriented to person, place, and time. Mental status is at baseline.  Psychiatric:        Mood and Affect: Mood normal.        Behavior: Behavior normal.        Thought Content: Thought content normal.        Judgment: Judgment normal.     Results for orders placed or performed in visit on 11/20/22  Microscopic Examination   Urine  Result Value Ref Range   WBC, UA 0-5 0 - 5 /hpf   RBC, Urine 3-10 (A) 0 - 2 /hpf   Epithelial Cells (non renal) 0-10 0 - 10 /hpf   Bacteria, UA None seen None seen/Few  Microscopic Examination   Urine  Result Value Ref  Range   WBC, UA >30W 0 - 5 /hpf   RBC, Urine 3-10 (A) 0 - 2 /hpf   Epithelial Cells (non renal) 0-10 0 - 10 /hpf   Bacteria, UA None seen None seen/Few  CBC with Differential/Platelet  Result Value Ref Range   WBC 6.8 3.4 - 10.8 x10E3/uL   RBC 4.09 3.77 - 5.28 x10E6/uL   Hemoglobin 11.2 11.1 - 15.9 g/dL   Hematocrit 34.1 34.0 - 46.6 %   MCV 83 79 - 97 fL   MCH 27.4 26.6 - 33.0 pg   MCHC 32.8 31.5 - 35.7 g/dL   RDW 12.2 11.7 - 15.4 %   Platelets 282 150 - 450 x10E3/uL   Neutrophils 64 Not Estab. %   Lymphs 25 Not Estab. %   Monocytes 8 Not Estab. %   Eos 2 Not Estab. %   Basos 1 Not Estab. %   Neutrophils Absolute 4.4 1.4 - 7.0 x10E3/uL   Lymphocytes Absolute 1.7 0.7 - 3.1 x10E3/uL   Monocytes Absolute 0.5 0.1 - 0.9 x10E3/uL   EOS (ABSOLUTE) 0.1 0.0 - 0.4 x10E3/uL   Basophils Absolute 0.0 0.0 - 0.2 x10E3/uL   Immature Granulocytes 0 Not Estab. %   Immature Grans (Abs) 0.0 0.0 - 0.1 x10E3/uL  Comprehensive metabolic panel  Result Value Ref Range   Glucose 85 70 - 99 mg/dL   BUN 14 6 - 24 mg/dL   Creatinine, Ser 0.90 0.57 - 1.00 mg/dL   eGFR 76 >59 mL/min/1.73   BUN/Creatinine Ratio 16 9 - 23   Sodium 138 134 - 144 mmol/L   Potassium 4.3 3.5 - 5.2 mmol/L   Chloride 99 96 - 106 mmol/L   CO2 21 20 - 29 mmol/L   Calcium 9.3 8.7 - 10.2 mg/dL   Total Protein 7.3 6.0 - 8.5 g/dL   Albumin 4.3 3.8 - 4.9 g/dL   Globulin, Total 3.0 1.5 - 4.5 g/dL   Albumin/Globulin Ratio 1.4 1.2 - 2.2   Bilirubin Total 0.6 0.0 - 1.2 mg/dL   Alkaline Phosphatase 81 44 - 121 IU/L   AST 22 0 - 40 IU/L   ALT 17 0 - 32 IU/L  Lipid Panel w/o Chol/HDL Ratio  Result Value Ref Range   Cholesterol, Total 324 (H) 100 - 199 mg/dL   Triglycerides 281 (H) 0 - 149  mg/dL   HDL 52 >39 mg/dL   VLDL Cholesterol Cal 57 (H) 5 - 40 mg/dL   LDL Chol Calc (NIH) 215 (H) 0 - 99 mg/dL   Lipid Comment: Comment   Urinalysis, Routine w reflex microscopic  Result Value Ref Range   Specific Gravity, UA 1.010 1.005 -  1.030   pH, UA 5.5 5.0 - 7.5   Color, UA Yellow Yellow   Appearance Ur Clear Clear   Leukocytes,UA Negative Negative   Protein,UA Negative Negative/Trace   Glucose, UA Negative Negative   Ketones, UA Negative Negative   RBC, UA 2+ (A) Negative   Bilirubin, UA Negative Negative   Urobilinogen, Ur 0.2 0.2 - 1.0 mg/dL   Nitrite, UA Negative Negative   Microscopic Examination See below:   TSH  Result Value Ref Range   TSH 1.970 0.450 - 4.500 uIU/mL  Urinalysis, Routine w reflex microscopic  Result Value Ref Range   Specific Gravity, UA 1.025 1.005 - 1.030   pH, UA 5.5 5.0 - 7.5   Color, UA Yellow Yellow   Appearance Ur Turbid (A) Clear   Leukocytes,UA 2+ (A) Negative   Protein,UA 1+ (A) Negative/Trace   Glucose, UA Negative Negative   Ketones, UA Negative Negative   RBC, UA 3+ (A) Negative   Bilirubin, UA Negative Negative   Urobilinogen, Ur 0.2 0.2 - 1.0 mg/dL   Nitrite, UA Negative Negative   Microscopic Examination See below:       Assessment & Plan:   Problem List Items Addressed This Visit   None Visit Diagnoses     Cardiac arrhythmia, unspecified cardiac arrhythmia type    -  Primary   Stressed the importance of going immediately to [employer] [facility]. Her husband is taking her now. Follow up after hospitalization.        Follow up plan: Return if symptoms worsen or fail to improve.  25 minutes spent with patient and her husband today

## 2023-01-15 NOTE — Consult Note (Addendum)
Cardiology Consultation   Patient ID: Julie Patel MRN: [id number]; DOB: 1966-09-02  Admit date: 01/15/2023 Date of Consult: 01/15/2023  PCP:  Valerie Roys, DO   [employer] Providers Cardiologist:  Kate Sable, MD   {    Patient Profile:   Julie Patel is a 57 y.o. female with a hx of GERD and anemia who is being seen 01/15/2023 for the evaluation of abnormal heart monitor at the request of Dr. Tyrone Nine, Dr. Silas Flood.  History of Present Illness:   [name] was referred to Dr. Silas Flood in December after a presumed syncopal event. She was planned for monitoring and an echo to evaluate further.  Dr. Kavin Leech office notified of pauses that occurred and she was referred to the ER. She is pending the echo.  She is here accompanied by her husband. 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 She remarks that the room was uncomfortably hot, but otherwise nothing unusual. They had completed the exam when sometime after she seemed to "wake up"   When she asked the student what happened her reported she was asleep. She has no real sense of time but suspect probably no longer then 5 minutes, (could have been shorter), because her door was open and thinks other staff would have noticed otherwise.  She has no pre-post symptoms of any kind.  She denies any hx of syncope otherwise, no palpitations, CP, SO, DOE. She had given blood 2 weeks prior  Labs done by her PMD the same day of her syncope noted her to be mildly anemic/iron def and started on Iron, just prior to that   [place] is pending WBC 4.6 H/H 12.3/38.5 Plts 252  While wearing the monitor she had no symptoms of any kind. "Pt triggered" tracings were apparently accidental   Pauses occurred at  12/27/22 (Sunday) 08:17, 17.7 seconds, she reports on a Sunday am this time she very well would have been in  bed, sleep/perhaps just waking 12/28/22 03:10 (x2) 7.4 seconds then 14 seconds, definitely sleeping  She has no symptoms currently outside of sinus congestion (she thinks triggered from environmental allergens), no cough/cold symptoms or fever.    She is not on any nodal blocking agents    Past Medical History:  Diagnosis Date   Abnormal uterine bleeding    Anemia    GERD (gastroesophageal reflux disease)    occ   Neck pain     Past Surgical History:  Procedure Laterality Date   ABLATION     DILITATION & CURRETTAGE/HYSTROSCOPY WITH NOVASURE ABLATION N/A 03/08/2017   Procedure: DILATATION & CURETTAGE/HYSTEROSCOPY WITH NOVASURE ABLATION;  Surgeon: Brayton Mars, MD;  Location: [facility] ORS;  Service: Gynecology;  Laterality: N/A;   WISDOM TOOTH EXTRACTION       Home Medications:  Prior to Admission medications   Medication Sig Start Date End Date Taking? Authorizing Provider  hydrocortisone (ANUSOL-HC) 2.5 % rectal cream Place 1 application  rectally 2 (two) times daily. Patient not taking: Reported on 11/20/2022 05/19/22   Park Liter P, DO  Iron, Ferrous Sulfate, 325 (65 Fe) MG TABS Take 325 mg by mouth daily. 11/13/22   Park Liter P, DO  multivitamin-iron-minerals-folic acid (CENTRUM) chewable tablet Chew 1 tablet by mouth daily.    [provider]  nitrofurantoin, macrocrystal-monohydrate, (MACROBID) 100 MG capsule Take 1 capsule (100 mg total) by mouth 2 (two) times daily.  11/24/22   Johnson, Megan P, DO  tretinoin (RETIN-A) 0.025 % gel Use a small layer topically as a spot treatment to affected areas of face nightly. Patient taking differently: 0.025 % daily as needed. Use a small layer topically as a spot treatment to affected areas of face nightly. 01/05/22   Ralene Bathe, MD    Inpatient Medications: Scheduled Meds:  Continuous Infusions:  PRN Meds:   Allergies:    Allergies  Allergen Reactions   Codeine Nausea And Vomiting    Social  History:   Social History   Socioeconomic History   Marital status: Married    Spouse name: Not on file   Number of children: Not on file   Years of education: Not on file   Highest education level: Not on file  Occupational History   Not on file  Tobacco Use   Smoking status: Never    Passive exposure: Past   Smokeless tobacco: Never  Vaping Use   Vaping Use: Never used  Substance and Sexual Activity   Alcohol use: No   Drug use: No   Sexual activity: Not Currently  Other Topics Concern   Not on file  Social History Narrative   Not on file   Social Determinants of Health   Financial Resource Strain: Not on file  Food Insecurity: Not on file  Transportation Needs: Not on file  Physical Activity: Not on file  Stress: Not on file  Social Connections: Not on file  Intimate Partner Violence: Not on file    Family History:   Family History  Problem Relation Age of Onset   Alzheimer's disease Mother    Cancer Father        Prostate   Hypertension Brother    Alzheimer's disease Maternal Grandmother    Cancer Maternal Grandfather        Lung   Diabetes Paternal Grandmother    Stroke Paternal Grandfather    Breast cancer Neg Hx    Ovarian cancer Neg Hx    Colon cancer Neg Hx      ROS:  Please see the history of present illness.  All other ROS reviewed and negative.     Physical Exam/Data:   Vitals:   01/15/23 1207 01/15/23 1211  BP: (!) 143/98   Pulse: 78   Resp: 16   Temp: 98.2 F (36.8 C)   TempSrc: Oral   SpO2: 97%   Weight:  83 kg  Height:  5' 4"$  (1.626 m)   No intake or output data in the 24 hours ending 01/15/23 1310    01/15/2023   12:11 PM 01/15/2023   10:59 AM 11/23/2022    2:11 PM  Last 3 Weights  Weight (lbs) 183 lb 183 lb 182 lb 12.8 oz  Weight (kg) 83.008 kg 83.008 kg 82.918 kg     Body mass index is 31.41 kg/m.  General:  Well nourished, well developed, in no acute distress HEENT: normal Neck: no JVD Vascular: No carotid bruits;  Distal pulses 2+ bilaterally Cardiac:  RRR; no murmurs, gallops or rubs Lungs:  CTA b/l no wheezing, rhonchi or rales  Abd: soft, nontender Ext: no edema Musculoskeletal:  No deformities Skin: warm and dry  Neuro: no focal abnormalities noted Psych:  Normal affect   EKG:  The EKG was personally reviewed and demonstrates:    SR 80bpm, normal intervals Telemetry:  Telemetry was personally reviewed and demonstrates:   SR 70's  Monitor reviewed 1/21/ 08:17, SB  50's >> marked sinus slowing with CHB resulting in 17second V pause 1/22 03:10 SB 50's > sinus and AV slowing to pause 7.4 seconsd >> marked SB >> marked SB with CHB V pause 14 seconds >> SR  Relevant CV Studies: Echo is pending  Laboratory Data:  High Sensitivity Troponin:  No results for input(s): "TROPONINIHS" in the last 720 hours.   ChemistryNo results for input(s): "NA", "K", "CL", "CO2", "GLUCOSE", "BUN", "CREATININE", "CALCIUM", "MG", "GFRNONAA", "GFRAA", "ANIONGAP" in the last 168 hours.  No results for input(s): "PROT", "ALBUMIN", "AST", "ALT", "ALKPHOS", "BILITOT" in the last 168 hours. Lipids No results for input(s): "CHOL", "TRIG", "HDL", "LABVLDL", "LDLCALC", "CHOLHDL" in the last 168 hours.  Hematology Recent Labs  Lab 01/15/23 1240  WBC 4.6  RBC 4.44  HGB 12.3  HCT 38.5  MCV 86.7  MCH 27.7  MCHC 31.9  RDW 14.2  PLT 252   Thyroid No results for input(s): "TSH", "FREET4" in the last 168 hours.  BNPNo results for input(s): "BNP", "PROBNP" in the last 168 hours.  DDimer No results for input(s): "DDIMER" in the last 168 hours.   Radiology/Studies:     Assessment and Plan:   Syncope Pauses  Syncope sounds unclear, one event only  Pauses all occur w/sleep In my review there is both sinus and AV slowing suggesting vagally mediated  She denies having known sleep apnea or any suspicion of it. She will likely need sleep study done  Chemistries are pending  Dr. Myles Gip will review and see her  for final recommendations  ADDEND: Dr. Myles Gip has reviewed monitor tracings and seen the patient She reported historical syncope (decades ago) associated with menstrual cramping and public speaking. Her husband reports that she does snore, no noted or clear apneic events.  Given pausing occurred with sleep no role for PPM Recommends sleep study. ( I will send referral) OK to discharge from [place], PA-C    Risk Assessment/Risk Scores:    For questions or updates, please contact [place] Please consult [email or web address] for contact info under    Signed, Baldwin [place], PA-C  01/15/2023 1:10 PM

## 2023-01-15 NOTE — Discharge Instructions (Signed)
The cardiologist thinks this might be something that is occurring during her sleep.  They are recommending that you get a sleep study.  Please discuss this over with your family doctor.  Please return for episode where you feel like you get a pass out or you pass out.

## 2023-01-15 NOTE — ED Provider Notes (Addendum)
[facility] Provider Note   CSN: [id number] Arrival date & time: 01/15/23  1204     History  Chief Complaint  Patient presents with   Abnormal ECG    Julie Patel is a 57 y.o. female.  57 yo F with a chief complaint of an abnormal Holter monitor.  She was called by her family doctor and told to come to the emergency department for cardiology evaluation.  She is wearing the Holter monitor for an episode of syncope.  She denies any issues currently, denies chest pain difficulty breathing abdominal pain cough congestion or fever.        Home Medications Prior to Admission medications   Medication Sig Start Date End Date Taking? Authorizing Provider  ibuprofen (ADVIL) 200 MG tablet Take 400 mg by mouth as needed for mild pain or moderate pain.   Yes [provider]  Iron, Ferrous Sulfate, 325 (65 Fe) MG TABS Take 325 mg by mouth daily. 11/13/22  Yes Johnson, Megan P, DO  multivitamin-iron-minerals-folic acid (CENTRUM) chewable tablet Chew 1 tablet by mouth daily.   Yes [provider]  Phenylephrine HCl (SUDAFED PE CONGESTION PO) Take 2 tablets by mouth as needed (cold).   Yes [provider]  tretinoin (RETIN-A) 0.025 % gel Use a small layer topically as a spot treatment to affected areas of face nightly. Patient taking differently: Apply 0.025 % topically daily as needed (acne). 01/05/22  Yes Ralene Bathe, MD  hydrocortisone (ANUSOL-HC) 2.5 % rectal cream Place 1 application  rectally 2 (two) times daily. Patient not taking: Reported on 11/20/2022 05/19/22   Park Liter P, DO  nitrofurantoin, macrocrystal-monohydrate, (MACROBID) 100 MG capsule Take 1 capsule (100 mg total) by mouth 2 (two) times daily. Patient not taking: Reported on 01/15/2023 11/24/22   Valerie Roys, DO      Allergies    Codeine    Review of Systems   Review of Systems  Physical Exam Updated Vital Signs BP 122/79    Pulse 72   Temp 98.2 F (36.8 C) (Oral)   Resp 16   Ht 5' 4"$  (1.626 m)   Wt 83 kg   SpO2 100%   BMI 31.41 kg/m  Physical Exam Vitals and nursing note reviewed.  Constitutional:      General: She is not in acute distress.    Appearance: She is well-developed. She is not diaphoretic.  HENT:     Head: Normocephalic and atraumatic.  Eyes:     Pupils: Pupils are equal, round, and reactive to light.  Cardiovascular:     Rate and Rhythm: Normal rate and regular rhythm.     Heart sounds: No murmur heard.    No friction rub. No gallop.  Pulmonary:     Effort: Pulmonary effort is normal.     Breath sounds: No wheezing or rales.  Abdominal:     General: There is no distension.     Palpations: Abdomen is soft.     Tenderness: There is no abdominal tenderness.  Musculoskeletal:        General: No tenderness.     Cervical back: Normal range of motion and neck supple.  Skin:    General: Skin is warm and dry.  Neurological:     Mental Status: She is alert and oriented to person, place, and time.  Psychiatric:        Behavior: Behavior normal.     ED Results / Procedures /  Treatments   Labs (all labs ordered are listed, but only abnormal results are displayed) Labs Reviewed  COMPREHENSIVE METABOLIC PANEL  MAGNESIUM  CBC WITH DIFFERENTIAL/PLATELET    EKG EKG Interpretation  Date/Time:  Friday January 15 2023 12:10:38 EST Ventricular Rate:  80 PR Interval:  138 QRS Duration: 84 QT Interval:  372 QTC Calculation: 429 R Axis:   91 Text Interpretation: Normal sinus rhythm Rightward axis Borderline ECG No old tracing to compare Confirmed by Deno Etienne [phone]) on 01/15/2023 12:41:51 PM  Radiology LONG TERM MONITOR (3-14 DAYS)  Result Date: 01/14/2023 Patch Wear Time:  13 days and 9 hours (2024-01-16T22:09:18-0500 to 2024-01-30T07:19:48-0500) Patient had a min HR of 41 bpm, max HR of 153 bpm, and avg HR of 82 bpm. Predominant underlying rhythm was Sinus Rhythm. 3 Pauses  occurred, the longest lasting 17.7 secs (3 bpm). Pauses occurred due to Possible High Graded AV Block. True duration of Pauses difficult to ascertain due to artifact. Isolated SVEs were rare (<1.0%), SVE Couplets were rare (<1.0%), and SVE Triplets were rare (<1.0%). Isolated VEs were rare (<1.0%), and no VE Couplets or VE Triplets were present. MD notification criteria for Pauses met - report posted prior to notification per account request (GU). Conclusion Multiple sinus pauses noted, lasting 17, 14 seconds. Likely reason for syncope. ED precautions, needs to be urgently evaluated by EP for pacemaker consideration.    Procedures .Critical Care  Performed by: Deno Etienne, DO Authorized by: Deno Etienne, DO   Critical care provider statement:    Critical care time (minutes):  35   Critical care time was exclusive of:  Separately billable procedures and treating other patients   Critical care was time spent personally by me on the following activities:  Development of treatment plan with patient or surrogate, discussions with consultants, evaluation of patient's response to treatment, examination of patient, ordering and review of laboratory studies, ordering and review of radiographic studies, ordering and performing treatments and interventions, pulse oximetry, re-evaluation of patient's condition and review of old charts   Care discussed with: admitting provider       Medications Ordered in ED Medications - No data to display  ED Course/ Medical Decision Making/ A&P                             Medical Decision Making Risk Decision regarding hospitalization.   57 yo F with a chief complaints of what sounds like sick sinus syndrome on record review.  The patient has had a Holter monitor recently for syncope without prodrome.  This occurred while she was at work.  Found to have multiple sinus pauses and encouraged to come here to be evaluated for pacemaker placement.  EKG here without  obvious concern.  Cardiology at bedside.   Cardiology evaluated the patient and felt this might be due to a problem with her sleep.  Does not sound like it is occurring otherwise throughout the day.  They do not feel it indicated need for pacemaker placement.  Will discharge home.  2:28 PM:  I have discussed the diagnosis/risks/treatment options with the patient.  Evaluation and diagnostic testing in the emergency department does not suggest an emergent condition requiring admission or immediate intervention beyond what has been performed at this time.  They will follow up with Cards. We also discussed returning to the ED immediately if new or worsening sx occur. We discussed the sx which are most concerning (e.g., sudden  worsening pain, fever, inability to tolerate by mouth) that necessitate immediate return. Medications administered to the patient during their visit and any new prescriptions provided to the patient are listed below.  Medications given during this visit Medications - No data to display   The patient appears reasonably screen and/or stabilized for discharge and I doubt any other medical condition or other [facility] requiring further screening, evaluation, or treatment in the ED at this time prior to discharge.    Medications - No data to display  Vitals:   01/15/23 1211 01/15/23 1300 01/15/23 1330 01/15/23 1345  BP:  135/83 122/79   Pulse:  74 78 72  Resp:  11 13 16  $ Temp:      TempSrc:      SpO2:  98% 100% 100%  Weight: 83 kg     Height: 5' 4"$  (1.626 m)       Final diagnoses:  Sinus pause          Final Clinical Impression(s) / ED Diagnoses Final diagnoses:  Sinus pause    Rx / DC Orders ED Discharge Orders     None         Deno Etienne, DO 01/15/23 [place], DO 01/15/23 1428

## 2023-01-15 NOTE — Telephone Encounter (Signed)
Spoke with patients husband per her request and reviewed results and recommendations with him. He verbalized understanding and repeated back instructions to proceed to ED at [facility] in [place]. No further questions at this time.

## 2023-01-15 NOTE — ED Triage Notes (Signed)
Patient sent by cards due to long term monitoring revealing sinus pauses lasting longest at 17 secs as well as a HR 41 all the way to 153 which may explain her syncopal episode.  Sent her for further testing and monitoring. Reports syncopal episode  happened awhile ago and saw PCP which determined she was anemic but then cardiology wanted her to do the holter monitor.

## 2023-01-19 ENCOUNTER — Ambulatory Visit: Payer: BC Managed Care – PPO | Attending: Cardiology

## 2023-01-19 DIAGNOSIS — R55 Syncope and collapse: Secondary | ICD-10-CM | POA: Diagnosis not present

## 2023-01-20 LAB — ECHOCARDIOGRAM COMPLETE
AR max vel: 2.25 cm2
AV Area VTI: 2.1 cm2
AV Area mean vel: 2.07 cm2
AV Mean grad: 3 mmHg
AV Peak grad: 5.4 mmHg
Ao pk vel: 1.16 m/s
Area-P 1/2: 5.38 cm2
Calc EF: 60.9 %
S' Lateral: 2.9 cm
Single Plane A2C EF: 56.8 %
Single Plane A4C EF: 63.1 %

## 2023-01-26 ENCOUNTER — Encounter: Payer: Self-pay | Admitting: Cardiology

## 2023-01-26 ENCOUNTER — Ambulatory Visit: Payer: BC Managed Care – PPO | Attending: Cardiology | Admitting: Cardiology

## 2023-01-26 VITALS — BP 128/76 | HR 70 | Ht 64.0 in | Wt 180.0 lb

## 2023-01-26 DIAGNOSIS — R0683 Snoring: Secondary | ICD-10-CM

## 2023-01-26 DIAGNOSIS — R55 Syncope and collapse: Secondary | ICD-10-CM | POA: Diagnosis not present

## 2023-01-26 DIAGNOSIS — E782 Mixed hyperlipidemia: Secondary | ICD-10-CM | POA: Diagnosis not present

## 2023-01-26 NOTE — Patient Instructions (Signed)
Medication Instructions:   Your physician recommends that you continue on your current medications as directed. Please refer to the Current Medication list given to you today.  *If you need a refill on your cardiac medications before your next appointment, please call your pharmacy*   Lab Work:  Your physician recommends that you return for lab work in: 4 months at the medical mall. You will need to be fasting.  No appt is needed. Hours are M-F 7AM- 6 PM.  If you have labs (blood work) drawn today and your tests are completely normal, you will receive your results only by: [place] (if you have MyChart) OR A paper copy in the mail If you have any lab test that is abnormal or we need to change your treatment, we will call you to review the results.   Testing/Procedures:  None Ordered   Follow-Up: At [facility], you and your health needs are our priority.  As part of our continuing mission to provide you with exceptional heart care, we have created designated Provider Care Teams.  These Care Teams include your primary Cardiologist (physician) and Advanced Practice Providers (APPs -  Physician Assistants and Nurse Practitioners) who all work together to provide you with the care you need, when you need it.  We recommend signing up for the patient portal called "MyChart".  Sign up information is provided on this After Visit Summary.  MyChart is used to connect with patients for Virtual Visits (Telemedicine).  Patients are able to view lab/test results, encounter notes, upcoming appointments, etc.  Non-urgent messages can be sent to your provider as well.   To learn more about what you can do with MyChart, go to [email or web address].    Your next appointment:   6 month(s)  Provider:   You may see Kate Sable, MD or one of the following Advanced Practice Providers on your designated Care Team:   Murray Hodgkins, NP Christell Faith, PA-C Cadence Kathlen Mody, PA-C Gerrie Nordmann, NP

## 2023-01-26 NOTE — Progress Notes (Signed)
Cardiology Office Note:    Date:  01/26/2023   ID:  Julie Patel, DOB 12-11-1965, MRN [id number]  PCP:  Valerie Roys, DO   [facility] Providers Cardiologist:  Kate Sable, MD     Referring MD: Valerie Roys, DO   Chief Complaint  Patient presents with   Follow-up    Testing F/U, no new cardiac concerns     History of Present Illness:    Julie Patel is a 57 y.o. female with a hx of GERD, hyperlipidemia who presents for follow-up.  Patient was previously seeing due to syncope.    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  Denies any previous symptoms, has not had any symptoms.  Echocardiogram and cardiac monitor was obtained to evaluate cardiac etiology.  Echo showed normal systolic function, no gross structural abnormalities.  Cardiac monitor revealed occasional nocturnal sinus pauses.  Evaluated by EP, deemed neurogenic.  Patient endorsed snoring when she sleeps.  Start him previously recommended, patient wanted to try eating healthy.  No new concerns at this time.   Past Medical History:  Diagnosis Date   Abnormal uterine bleeding    Anemia    GERD (gastroesophageal reflux disease)    occ   Neck pain     Past Surgical History:  Procedure Laterality Date   ABLATION     DILITATION & CURRETTAGE/HYSTROSCOPY WITH NOVASURE ABLATION N/A 03/08/2017   Procedure: DILATATION & CURETTAGE/HYSTEROSCOPY WITH NOVASURE ABLATION;  Surgeon: Brayton Mars, MD;  Location: [facility] ORS;  Service: Gynecology;  Laterality: N/A;   WISDOM TOOTH EXTRACTION      Current Medications: Current Meds  Medication Sig   Iron, Ferrous Sulfate, 325 (65 Fe) MG TABS Take 325 mg by mouth daily.   multivitamin-iron-minerals-folic acid (CENTRUM) chewable tablet Chew 1 tablet by mouth daily.   tretinoin (RETIN-A) 0.025 % gel Use a small layer topically as a spot treatment to affected areas of face nightly.  (Patient taking differently: Apply 0.025 % topically daily as needed (acne).)     Allergies:   Codeine   Social History   Socioeconomic History   Marital status: Married    Spouse name: Not on file   Number of children: Not on file   Years of education: Not on file   Highest education level: Not on file  Occupational History   Not on file  Tobacco Use   Smoking status: Never    Passive exposure: Past   Smokeless tobacco: Never  Vaping Use   Vaping Use: Never used  Substance and Sexual Activity   Alcohol use: No   Drug use: No   Sexual activity: Not Currently  Other Topics Concern   Not on file  Social History Narrative   Not on file   Social Determinants of Health   Financial Resource Strain: Not on file  Food Insecurity: Not on file  Transportation Needs: Not on file  Physical Activity: Not on file  Stress: Not on file  Social Connections: Not on file     Family History: The patient's family history includes Alzheimer's disease in her maternal grandmother and mother; Cancer in her father and maternal grandfather; Diabetes in her paternal grandmother; Hypertension in her brother; Stroke in her paternal grandfather. There is no history of Breast cancer, Ovarian cancer, or Colon cancer.  ROS:   Please see the history of present illness.     All other systems  reviewed and are negative.  EKGs/Labs/Other Studies Reviewed:    The following studies were reviewed today:   EKG:  EKG not ordered today.    Recent Labs: 11/20/2022: TSH 1.970 01/15/2023: ALT 19; BUN 11; Creatinine, Ser 0.93; Hemoglobin 12.3; Magnesium 2.4; Platelets 252; Potassium 4.6; Sodium 136  Recent Lipid Panel    Component Value Date/Time   CHOL 324 (H) 11/20/2022 1553   TRIG 281 (H) 11/20/2022 1553   HDL 52 11/20/2022 1553   LDLCALC 215 (H) 11/20/2022 1553     Risk Assessment/Calculations:                Physical Exam:    VS:  BP 128/76 (BP Location: Left Arm, Patient Position:  Sitting, Cuff Size: Normal)   Pulse 70   Ht 5' 4"$  (1.626 m)   Wt 180 lb (81.6 kg)   SpO2 99%   BMI 30.90 kg/m     Wt Readings from Last 3 Encounters:  01/26/23 180 lb (81.6 kg)  01/15/23 183 lb (83 kg)  01/15/23 183 lb (83 kg)     GEN:  Well nourished, well developed in no acute distress HEENT: Normal NECK: No JVD; No carotid bruits CARDIAC: RRR, no murmurs, rubs, gallops RESPIRATORY:  Clear to auscultation without rales, wheezing or rhonchi  ABDOMEN: Soft, non-tender, non-distended MUSCULOSKELETAL:  No edema; No deformity  SKIN: Warm and dry NEUROLOGIC:  Alert and oriented x 3 PSYCHIATRIC:  Normal affect   ASSESSMENT:    1. Syncope, unspecified syncope type   2. Snoring   3. Mixed hyperlipidemia     PLAN:    In order of problems listed above:  Syncope, cardiac monitor with multiple sinus pauses.  Echo with normal EF.  Nocturnal pauses, evaluated by EP, etiology deemed vasovagal, no indication for PPM.  Refer for OSA eval. History of snoring, nocturnal sinus pauses.  Refer to sleep specialist for OSA eval. Hyperlipidemia, LDL 215 suggesting FH.  Statin therapy recommended, patient would like to try diet. Low-cholesterol diet advised.  Repeat lipid panel in 5 months.  Follow-up in 6 months.    Medication Adjustments/Labs and Tests Ordered: Current medicines are reviewed at length with the patient today.  Concerns regarding medicines are outlined above.  Orders Placed This Encounter  Procedures   Ambulatory referral to Pulmonology   No orders of the defined types were placed in this encounter.   Patient Instructions  Medication Instructions:   Your physician recommends that you continue on your current medications as directed. Please refer to the Current Medication list given to you today.  *If you need a refill on your cardiac medications before your next appointment, please call your pharmacy*   Lab Work:  Your physician recommends that you return for lab  work in: [facility] mall. You will need to be fasting.  No appt is needed. Hours are M-F 7AM- 6 PM.  If you have labs (blood work) drawn today and your tests are completely normal, you will receive your results only by: [place] (if you have MyChart) OR A paper copy in the mail If you have any lab test that is abnormal or we need to change your treatment, we will call you to review the results.   Testing/Procedures:  None Ordered   Follow-Up: At [facility], you and your health needs are our priority.  As part of our continuing mission to provide you with exceptional heart care, we have created designated Provider Care Teams.  These Care Teams include your primary Cardiologist (physician) and Advanced Practice Providers (APPs -  Physician Assistants and Nurse Practitioners) who all work together to provide you with the care you need, when you need it.  We recommend signing up for the patient portal called "MyChart".  Sign up information is provided on this After Visit Summary.  MyChart is used to connect with patients for Virtual Visits (Telemedicine).  Patients are able to view lab/test results, encounter notes, upcoming appointments, etc.  Non-urgent messages can be sent to your provider as well.   To learn more about what you can do with MyChart, go to [email or web address].    Your next appointment:   6 month(s)  Provider:   You may see Kate Sable, MD or one of the following Advanced Practice Providers on your designated Care Team:   Murray Hodgkins, NP Christell Faith, PA-C Cadence Kathlen Mody, PA-C Gerrie Nordmann, NP   Signed, Kate Sable, MD  01/26/2023 4:48 PM    [place]

## 2023-01-28 ENCOUNTER — Encounter: Payer: Self-pay | Admitting: Cardiology

## 2023-02-04 ENCOUNTER — Encounter: Payer: Self-pay | Admitting: Family Medicine

## 2023-02-04 ENCOUNTER — Ambulatory Visit
Admission: RE | Admit: 2023-02-04 | Discharge: 2023-02-04 | Disposition: A | Discharge status: Home / Self Care | Payer: BC Managed Care – PPO | Source: Ambulatory Visit | Attending: Family Medicine | Admitting: Family Medicine

## 2023-02-04 ENCOUNTER — Ambulatory Visit: Payer: BC Managed Care – PPO | Admitting: Family Medicine

## 2023-02-04 VITALS — BP 129/80 | HR 74 | Temp 98.2°F | Ht 64.0 in | Wt 181.2 lb

## 2023-02-04 DIAGNOSIS — R55 Syncope and collapse: Secondary | ICD-10-CM | POA: Insufficient documentation

## 2023-02-04 DIAGNOSIS — R4020 Unspecified coma: Secondary | ICD-10-CM | POA: Insufficient documentation

## 2023-02-04 NOTE — Progress Notes (Addendum)
BP 129/80   Pulse 74   Temp 98.2 F (36.8 C) (Oral)   Ht '5\' 4"'$  (1.626 m)   Wt 181 lb 3.2 oz (82.2 kg)   SpO2 100%   BMI 31.10 kg/m    Subjective:    Patient ID: Julie Patel, female    DOB: 1966-09-12, 58 y.o.   MRN: [id number]  HPI: Julie Patel is a 57 y.o. female  Chief Complaint  Patient presents with   Loss of Consciousness    Patient says she had another episode of Syncope while at work.   Passed out at school. Felt totally normal and then just woke up on the floor. Did not have any warning signs. She is unaware for how long she was out for. No idea how long she was out for, no idea what happened while she was out. Refused EMS. Unsure if she hit her head, she did fall. No pain or bruising at this time. She has no memory for shortly after passing out. She currently is not having any numbness or tingling or any changes in her vision. No weakness. She was confused for a while after waking up. Very little information available. She passed out previously in December also with no warning. She has had a cardiac work up since then which showed some sinus pauses, but electrophysiology thought that this was likely neurogenic and did not feel like she needed a pacemaker. They did recommend a sleep study which is pending. She used to pass out as a child. Has not passed out in 30 years prior to these recent episodes. She is still a little tired and not quite feeling like herself now, but notes that she was not anxious and had no warning prior to her episode.   Relevant past medical, surgical, family and social history reviewed and updated as indicated. Interim medical history since our last visit reviewed. Allergies and medications reviewed and updated.  Review of Systems  Constitutional: Negative.   HENT: Negative.    Eyes: Negative.   Respiratory: Negative.    Cardiovascular: Negative.   Gastrointestinal: Negative.   Neurological:  Positive for syncope. Negative for  dizziness, tremors, seizures, facial asymmetry, speech difficulty, weakness, light-headedness, numbness and headaches.  Psychiatric/Behavioral: Negative.      Per HPI unless specifically indicated above     Objective:    BP 129/80   Pulse 74   Temp 98.2 F (36.8 C) (Oral)   Ht '5\' 4"'$  (1.626 m)   Wt 181 lb 3.2 oz (82.2 kg)   SpO2 100%   BMI 31.10 kg/m   Wt Readings from Last 3 Encounters:  02/04/23 181 lb 3.2 oz (82.2 kg)  01/26/23 180 lb (81.6 kg)  01/15/23 183 lb (83 kg)    Physical Exam Vitals and nursing note reviewed.  Constitutional:      General: She is not in acute distress.    Appearance: Normal appearance. She is not ill-appearing, toxic-appearing or diaphoretic.  HENT:     Head: Normocephalic and atraumatic.     Right Ear: External ear normal.     Left Ear: External ear normal.     Nose: Nose normal.     Mouth/Throat:     Mouth: Mucous membranes are moist.     Pharynx: Oropharynx is clear.  Eyes:     General: No scleral icterus.       Right eye: No discharge.        Left eye: No discharge.  Extraocular Movements: Extraocular movements intact.     Conjunctiva/sclera: Conjunctivae normal.     Pupils: Pupils are equal, round, and reactive to light.  Cardiovascular:     Rate and Rhythm: Normal rate and regular rhythm.     Pulses: Normal pulses.     Heart sounds: Normal heart sounds. No murmur heard.    No friction rub. No gallop.  Pulmonary:     Effort: Pulmonary effort is normal. No respiratory distress.     Breath sounds: Normal breath sounds. No stridor. No wheezing, rhonchi or rales.  Chest:     Chest wall: No tenderness.  Musculoskeletal:        General: Normal range of motion.     Cervical back: Normal range of motion and neck supple.  Skin:    General: Skin is warm and dry.     Capillary Refill: Capillary refill takes less than 2 seconds.     Coloration: Skin is not jaundiced or pale.     Findings: No bruising, erythema, lesion or rash.   Neurological:     General: No focal deficit present.     Mental Status: She is alert and oriented to person, place, and time. Mental status is at baseline.     Cranial Nerves: No cranial nerve deficit.     Sensory: No sensory deficit.     Motor: No weakness.     Coordination: Coordination normal.     Gait: Gait normal.     Deep Tendon Reflexes: Reflexes normal.  Psychiatric:        Mood and Affect: Mood normal.        Behavior: Behavior normal.        Thought Content: Thought content normal.        Judgment: Judgment normal.     Results for orders placed or performed in visit on 01/19/23  ECHOCARDIOGRAM COMPLETE  Result Value Ref Range   AR max vel 2.25 cm2   AV Peak grad 5.4 mmHg   Ao pk vel 1.16 m/s   S' Lateral 2.90 cm   Area-P 1/2 5.38 cm2   AV Area VTI 2.10 cm2   AV Mean grad 3.0 mmHg   Single Plane A4C EF 63.1 %   Single Plane A2C EF 56.8 %   Calc EF 60.9 %   AV Area mean vel 2.07 cm2   Est EF 60 - 65%       Assessment & Plan:   Problem List Items Addressed This Visit   None Visit Diagnoses     Syncope, unspecified syncope type    -  Primary   Previous cardiac work up was normal. Concern for stroke vs seizure. Will get CT head and get her into neurology ASAP. Referral generated.   Relevant Orders   EKG 12-Lead (Completed)   CT HEAD WO CONTRAST (5MM)   Ambulatory referral to Neurology   Loss of consciousness ([place])       Loss of consciousness with fall. Unwitnessed. Will obtain CT head to r/o stroke vs hemorrhage. Await results.   Relevant Orders   CT HEAD WO CONTRAST (5MM)   Ambulatory referral to Neurology        Follow up plan: Return Pending results.Marland Kitchen

## 2023-02-07 NOTE — Progress Notes (Signed)
Interpreted by me on 02/04/23. NSR at 64bpm. No ST segment changes.

## 2023-02-26 ENCOUNTER — Ambulatory Visit (INDEPENDENT_AMBULATORY_CARE_PROVIDER_SITE_OTHER): Payer: BC Managed Care – PPO | Admitting: Primary Care

## 2023-02-26 ENCOUNTER — Encounter: Payer: Self-pay | Admitting: Primary Care

## 2023-02-26 VITALS — BP 130/82 | HR 87 | Temp 97.8°F | Ht 64.0 in | Wt 181.8 lb

## 2023-02-26 DIAGNOSIS — R0683 Snoring: Secondary | ICD-10-CM | POA: Diagnosis not present

## 2023-02-26 NOTE — Patient Instructions (Signed)
Low-moderate suspicions you may have sleep apnea, need to check in-lab sleep study d/t cardiac history   Focus on side sleeping position or get a wedge pillow to elevate head if you sleep on your back Do not drive   Orders: Split-night sleep study (ordered)  Follow-up: Please call 1 to 2 weeks after completing sleep study to review results and treatment options

## 2023-02-26 NOTE — Progress Notes (Signed)
@Patient  ID: Julie Patel, female    DOB: 1966/03/08, 57 y.o.   MRN: [id number]  Chief Complaint  Patient presents with   sleep consult    No prior sleep study-c/o snoring and stops breathing during sleep.     Referring provider: Kate Sable, MD  HPI: 57 year old female, passive smoke exposure.  Past medical history significant for syncope, hyperlipidemia, cardiac arrhythmia.  02/26/2023 Patient presents today for sleep consult. Hx syncope. She has been seen by cardiology. Etiology was deemed vasovagal. No indication for PPM. Referred for sleep evaluation. She has symptoms of loud snoring. She has never woken up gasping/choking at night. No shortness of breath. She has passed out twice while at work. She works as a Pharmacist, hospital. Possible seizure activity was witnessed by her students. Her husband has never witnessed her lose consciousness.  Typical bedtime is 11 PM.  It takes her on average 5 minutes to fall asleep.  She wakes up once a night to use the restroom.  She starts her day at 6 AM.  She does not operate heavy machinery.  Her weight has remained stable.  No previous sleep studies.  She does not currently wear CPAP or oxygen.  Epworth score is 3.  Social history She is married with children.  She lives with her husband.  She works as a Optometrist.  Allergies  Allergen Reactions   Codeine Nausea And Vomiting    Immunization History  Administered Date(s) Administered   Influenza,inj,Quad PF,6+ Mos 09/14/2016, 09/27/2018, 11/07/2019, 09/06/2021   Influenza-Unspecified 08/17/2022, 10/07/2022   Tdap 12/07/2008, 11/07/2019    Past Medical History:  Diagnosis Date   Abnormal uterine bleeding    Anemia    GERD (gastroesophageal reflux disease)    occ   Neck pain    Syncope     Tobacco History: Social History   Tobacco Use  Smoking Status Never   Passive exposure: Past  Smokeless Tobacco Never   Counseling given: Not Answered   Outpatient  Medications Prior to Visit  Medication Sig Dispense Refill   ibuprofen (ADVIL) 200 MG tablet Take 400 mg by mouth as needed for mild pain or moderate pain.     Iron, Ferrous Sulfate, 325 (65 Fe) MG TABS Take 325 mg by mouth daily. 30 tablet 3   multivitamin-iron-minerals-folic acid (CENTRUM) chewable tablet Chew 1 tablet by mouth daily.     hydrocortisone (ANUSOL-HC) 2.5 % rectal cream Place 1 application  rectally 2 (two) times daily. (Patient not taking: Reported on 01/26/2023) 30 g 3   nitrofurantoin, macrocrystal-monohydrate, (MACROBID) 100 MG capsule Take 1 capsule (100 mg total) by mouth 2 (two) times daily. (Patient not taking: Reported on 01/26/2023) 14 capsule 0   Phenylephrine HCl (SUDAFED PE CONGESTION PO) Take 2 tablets by mouth as needed (cold). (Patient not taking: Reported on 01/26/2023)     tretinoin (RETIN-A) 0.025 % gel Use a small layer topically as a spot treatment to affected areas of face nightly. (Patient not taking: Reported on 02/04/2023) 45 g 6   No facility-administered medications prior to visit.      Review of Systems  Review of Systems  Constitutional: Negative.   HENT: Negative.    Respiratory: Negative.  Negative for shortness of breath.   Cardiovascular: Negative.  Negative for palpitations.  Psychiatric/Behavioral:  Negative for sleep disturbance.    Physical Exam  BP 130/82 (BP Location: Left Arm, Cuff Size: Normal)   Pulse 87   Temp 97.8 F (36.6 C) (Temporal)  Ht 5\' 4"  (1.626 m)   Wt 181 lb 12.8 oz (82.5 kg)   SpO2 96%   BMI 31.21 kg/m  Physical Exam Constitutional:      Appearance: Normal appearance.  HENT:     Head: Normocephalic and atraumatic.  Cardiovascular:     Rate and Rhythm: Normal rate and regular rhythm.     Comments: RRR Pulmonary:     Effort: Pulmonary effort is normal.     Breath sounds: Normal breath sounds.  Neurological:     General: No focal deficit present.     Mental Status: She is alert and oriented to person,  place, and time. Mental status is at baseline.     Comments: Grossly intact  Psychiatric:        Mood and Affect: Mood normal.        Behavior: Behavior normal.        Thought Content: Thought content normal.        Judgment: Judgment normal.      Lab Results:  CBC    Component Value Date/Time   WBC 4.6 01/15/2023 1240   RBC 4.44 01/15/2023 1240   HGB 12.3 01/15/2023 1240   HGB 11.2 11/20/2022 1553   HCT 38.5 01/15/2023 1240   HCT 34.1 11/20/2022 1553   PLT 252 01/15/2023 1240   PLT 282 11/20/2022 1553   MCV 86.7 01/15/2023 1240   MCV 83 11/20/2022 1553   MCH 27.7 01/15/2023 1240   MCHC 31.9 01/15/2023 1240   RDW 14.2 01/15/2023 1240   RDW 12.2 11/20/2022 1553   LYMPHSABS 1.2 01/15/2023 1240   LYMPHSABS 1.7 11/20/2022 1553   MONOABS 0.6 01/15/2023 1240   EOSABS 0.1 01/15/2023 1240   EOSABS 0.1 11/20/2022 1553   BASOSABS 0.0 01/15/2023 1240   BASOSABS 0.0 11/20/2022 1553    BMET    Component Value Date/Time   NA 136 01/15/2023 1240   NA 138 11/20/2022 1553   K 4.6 01/15/2023 1240   CL 102 01/15/2023 1240   CO2 24 01/15/2023 1240   GLUCOSE 91 01/15/2023 1240   BUN 11 01/15/2023 1240   BUN 14 11/20/2022 1553   CREATININE 0.93 01/15/2023 1240   CALCIUM 9.2 01/15/2023 1240   GFRNONAA >60 01/15/2023 1240   GFRAA 80 11/12/2020 1506    BNP No results found for: "BNP"  ProBNP No results found for: "PROBNP"  Imaging: CT HEAD WO CONTRAST (5MM)  Result Date: 02/04/2023 CLINICAL DATA:  Syncope/presyncope, cerebrovascular cause suspected EXAM: CT HEAD WITHOUT CONTRAST TECHNIQUE: Contiguous axial images were obtained from the base of the skull through the vertex without intravenous contrast. RADIATION DOSE REDUCTION: This exam was performed according to the departmental dose-optimization program which includes automated exposure control, adjustment of the mA and/or kV according to patient size and/or use of iterative reconstruction technique. COMPARISON:  None  Available. FINDINGS: Brain: No evidence of acute infarction, hemorrhage, hydrocephalus, extra-axial collection or mass lesion/mass effect. Vascular: No hyperdense vessel identified. Skull: No acute fracture. Sinuses/Orbits: Clear stenosis.  No acute orbital findings. Other: No mastoid effusions. IMPRESSION: No evidence of acute intracranial abnormality. Electronically Signed   By: Margaretha Sheffield M.D.   On: 02/04/2023 13:54     Assessment & Plan:   Loud snoring - Hx syncopal episodes and sinus pauses on cardiac monitor, concern for sleep apnea. She has symptoms loud snoring. No associated daytime sleepiness. Epworth score 3/24. She needs in-lab sleep testing d.t cardiac arrhythmia and possible seizure activity.  We reviewed risks of  untreated sleep apnea including cardiac arrhythmias, pulmonary hypertension, diabetes and stroke.  We discussed treatment options including weight loss, oral appliance, CPAP therapy or referral to ENT for possible surgical options.  If patient has mild obstructive sleep apnea, would recommend patient be started on CPAP due to her cardiac history.  Advised patient focus on side sleeping position or elevate head with wedge pillow.  She is not currently driving.  She does not drink alcohol.  Follow-up 1 to 2 weeks after sleep study review results and treatment options.     Martyn Ehrich, NP 02/26/2023

## 2023-02-26 NOTE — Assessment & Plan Note (Addendum)
-   Hx syncopal episodes and sinus pauses on cardiac monitor, concern for sleep apnea. She has symptoms loud snoring. No associated daytime sleepiness. Epworth score 3/24. She needs in-lab sleep testing d.t cardiac arrhythmia and possible seizure activity.  We reviewed risks of untreated sleep apnea including cardiac arrhythmias, pulmonary hypertension, diabetes and stroke.  We discussed treatment options including weight loss, oral appliance, CPAP therapy or referral to ENT for possible surgical options.  If patient has mild obstructive sleep apnea, would recommend patient be started on CPAP due to her cardiac history.  Advised patient focus on side sleeping position or elevate head with wedge pillow.  She is not currently driving.  She does not drink alcohol.  Follow-up 1 to 2 weeks after sleep study review results and treatment options.

## 2023-02-27 NOTE — Progress Notes (Signed)
Reviewed and agree with assessment/plan.   Chesley Mires, MD [facility] Pulmonary/Critical Care 02/27/2023, 7:00 AM Pager:  [phone]

## 2023-04-05 ENCOUNTER — Telehealth: Payer: Self-pay | Admitting: Primary Care

## 2023-04-05 NOTE — Telephone Encounter (Signed)
Noted. Thanks.

## 2023-04-05 NOTE — Telephone Encounter (Signed)
Beth you saw this patient on 02/26/23 and order in lab sleep study. We received note from [facility] today stating the patient has not returned their call to scheduled the sleep study. They will not make further attempts to schedule the patient

## 2023-04-16 NOTE — Telephone Encounter (Signed)
Patient showed up in our office on 04/15/23 because someone scheduled her in [place] under Dr. Armanda Magic and she wanted to do the sleep study here in [place]. We gave her [facility] number to call since they had tried several times to get the sleep study scheduled and she never responded. It looks like the [facility] sleep study was scheduled under ED note from 01/15/23

## 2023-04-21 ENCOUNTER — Other Ambulatory Visit: Payer: Self-pay | Admitting: Student

## 2023-04-21 DIAGNOSIS — R55 Syncope and collapse: Secondary | ICD-10-CM

## 2023-05-19 ENCOUNTER — Ambulatory Visit: Payer: BC Managed Care – PPO

## 2023-05-19 DIAGNOSIS — Z1211 Encounter for screening for malignant neoplasm of colon: Secondary | ICD-10-CM

## 2023-05-19 DIAGNOSIS — D122 Benign neoplasm of ascending colon: Secondary | ICD-10-CM

## 2023-05-19 DIAGNOSIS — D125 Benign neoplasm of sigmoid colon: Secondary | ICD-10-CM

## 2023-05-19 DIAGNOSIS — K621 Rectal polyp: Secondary | ICD-10-CM

## 2023-05-19 DIAGNOSIS — K635 Polyp of colon: Secondary | ICD-10-CM

## 2023-05-19 LAB — HM COLONOSCOPY

## 2023-05-24 ENCOUNTER — Ambulatory Visit (HOSPITAL_BASED_OUTPATIENT_CLINIC_OR_DEPARTMENT_OTHER): Payer: BC Managed Care – PPO | Attending: Physician Assistant | Admitting: Cardiology

## 2023-05-24 DIAGNOSIS — R0683 Snoring: Secondary | ICD-10-CM | POA: Diagnosis not present

## 2023-05-24 DIAGNOSIS — R400 Somnolence: Secondary | ICD-10-CM | POA: Insufficient documentation

## 2023-05-25 ENCOUNTER — Ambulatory Visit
Admission: RE | Admit: 2023-05-25 | Discharge: 2023-05-25 | Disposition: A | Discharge status: Home / Self Care | Payer: BC Managed Care – PPO | Source: Ambulatory Visit | Attending: Family Medicine | Admitting: Family Medicine

## 2023-05-25 DIAGNOSIS — Z1231 Encounter for screening mammogram for malignant neoplasm of breast: Secondary | ICD-10-CM | POA: Insufficient documentation

## 2023-05-26 NOTE — Procedures (Signed)
    Patient Name: Julie Patel, Julie Patel Date:05/24/2023 Gender: Female D.O.B: 1966/02/04 Age (years): 56 Referring Provider: Ed Blalock Keitha Butte PA-C Height (inches): 64 Interpreting Physician: Armanda Magic MD, ABSM Weight (lbs): 160 RPSGT: Rosette Reveal BMI: 27 MRN: [id number] Neck Size: 15.00  CLINICAL INFORMATION Sleep Study Type: NPSG  Indication for sleep study: N/A  Epworth Sleepiness Score: 4  SLEEP STUDY TECHNIQUE As per the AASM Manual for the Scoring of Sleep and Associated Events v2.3 (April 2016) with a hypopnea requiring 4% desaturations.  The channels recorded and monitored were frontal, central and occipital EEG, electrooculogram (EOG), submentalis EMG (chin), nasal and oral airflow, thoracic and abdominal wall motion, anterior tibialis EMG, snore microphone, electrocardiogram, and pulse oximetry.  MEDICATIONS Medications self-administered by patient taken the night of the study : N/A  SLEEP ARCHITECTURE The study was initiated at 10:48:40 PM and ended at 5:02:51 AM.  Sleep onset time was 25.9 minutes and the sleep efficiency was 70.6%. The total sleep time was 264 minutes.  Stage REM latency was 67.0 minutes.  The patient spent 0.9% of the night in stage N1 sleep, 83.5% in stage N2 sleep, 0.0% in stage N3 and 15.5% in REM.  Alpha intrusion was absent.  Supine sleep was 100.00%.  RESPIRATORY PARAMETERS The overall apnea/hypopnea index (AHI) was 4.1 per hour. There were 0 total apneas, including 0 obstructive, 0 central and 0 mixed apneas. There were 18 hypopneas and 7 RERAs.  The AHI during Stage REM sleep was 23.4 per hour.  AHI while supine was 4.1 per hour.  The mean oxygen saturation was 95.6%. The minimum SpO2 during sleep was 90.0%.  moderate snoring was noted during this study.  CARDIAC DATA The 2 lead EKG demonstrated sinus rhythm. The mean heart rate was 62.3 beats per minute. Other EKG findings include: None.  LEG MOVEMENT DATA The  total PLMS were 0 with a resulting PLMS index of 0.0. Associated arousal with leg movement index was 0.0 .  IMPRESSIONS - No significant obstructive sleep apnea occurred during this study (AHI = 4.1/h). - The patient had minimal or no oxygen desaturation during the study (Min O2 = 90.0%) - The patient snored with moderate snoring volume. - No cardiac abnormalities were noted during this study. - Clinically significant periodic limb movements did not occur during sleep. No significant associated arousals.  DIAGNOSIS - Norma Study  RECOMMENDATIONS - Avoid alcohol, sedatives and other CNS depressants that may worsen sleep apnea and disrupt normal sleep architecture. - Sleep hygiene should be reviewed to assess factors that may improve sleep quality. - Weight management and regular exercise should be initiated or continued if appropriate.  [Electronically signed] 05/26/2023 08:25 AM  Armanda Magic MD, ABSM Diplomate, American Board of Sleep Medicine

## 2023-05-27 ENCOUNTER — Other Ambulatory Visit
Admission: RE | Admit: 2023-05-27 | Discharge: 2023-05-27 | Disposition: A | Discharge status: Home / Self Care | Payer: BC Managed Care – PPO | Attending: Cardiology | Admitting: Cardiology

## 2023-05-27 ENCOUNTER — Other Ambulatory Visit: Payer: Self-pay | Admitting: Family Medicine

## 2023-05-27 DIAGNOSIS — R928 Other abnormal and inconclusive findings on diagnostic imaging of breast: Secondary | ICD-10-CM

## 2023-05-27 DIAGNOSIS — N63 Unspecified lump in unspecified breast: Secondary | ICD-10-CM | POA: Insufficient documentation

## 2023-05-27 LAB — COMPREHENSIVE METABOLIC PANEL
ALT: 19 U/L (ref 0–44)
AST: 24 U/L (ref 15–41)
Albumin: 3.8 g/dL (ref 3.5–5.0)
Alkaline Phosphatase: 55 U/L (ref 38–126)
Anion gap: 9 (ref 5–15)
BUN: 15 mg/dL (ref 6–20)
CO2: 24 mmol/L (ref 22–32)
Calcium: 8.7 mg/dL — ABNORMAL LOW (ref 8.9–10.3)
Chloride: 104 mmol/L (ref 98–111)
Creatinine, Ser: 0.7 mg/dL (ref 0.44–1.00)
GFR, Estimated: >60 mL/min (ref >60)
Glucose, Bld: 91 mg/dL (ref 70–99)
Potassium: 4.1 mmol/L (ref 3.5–5.1)
Sodium: 137 mmol/L (ref 135–145)
Total Bilirubin: 1.2 mg/dL (ref 0.3–1.2)
Total Protein: 7.1 g/dL (ref 6.5–8.1)

## 2023-05-27 LAB — CBC WITH DIFFERENTIAL/PLATELET
Abs Immature Granulocytes: 0.01 10*3/uL (ref 0.00–0.07)
Basophils Absolute: 0 10*3/uL (ref 0.0–0.1)
Basophils Relative: 1 %
Eosinophils Absolute: 0.1 10*3/uL (ref 0.0–0.5)
Eosinophils Relative: 2 %
HCT: 39 % (ref 36.0–46.0)
Hemoglobin: 12.8 g/dL (ref 12.0–15.0)
Immature Granulocytes: 0 %
Lymphocytes Relative: 25 %
Lymphs Abs: 1.5 10*3/uL (ref 0.7–4.0)
MCH: 29.2 pg (ref 26.0–34.0)
MCHC: 32.8 g/dL (ref 30.0–36.0)
MCV: 88.8 fL (ref 80.0–100.0)
Monocytes Absolute: 0.4 10*3/uL (ref 0.1–1.0)
Monocytes Relative: 6 %
Neutro Abs: 3.8 10*3/uL (ref 1.7–7.7)
Neutrophils Relative %: 66 %
Platelets: 233 10*3/uL (ref 150–400)
RBC: 4.39 MIL/uL (ref 3.87–5.11)
RDW: 13.1 % (ref 11.5–15.5)
WBC: 5.8 10*3/uL (ref 4.0–10.5)
nRBC: 0 % (ref 0.0–0.2)

## 2023-05-27 LAB — MAGNESIUM: Magnesium: 2.3 mg/dL (ref 1.7–2.4)

## 2023-05-27 NOTE — Telephone Encounter (Signed)
Julie Patel the patient did sleep study on 05/24/23 under Armanda Magic

## 2023-05-28 ENCOUNTER — Ambulatory Visit
Admission: RE | Admit: 2023-05-28 | Discharge: 2023-05-28 | Disposition: A | Discharge status: Home / Self Care | Payer: BC Managed Care – PPO | Source: Ambulatory Visit | Attending: Family Medicine | Admitting: Family Medicine

## 2023-05-28 DIAGNOSIS — N63 Unspecified lump in unspecified breast: Secondary | ICD-10-CM | POA: Diagnosis present

## 2023-05-28 DIAGNOSIS — R928 Other abnormal and inconclusive findings on diagnostic imaging of breast: Secondary | ICD-10-CM | POA: Diagnosis not present

## 2023-05-31 ENCOUNTER — Ambulatory Visit
Admission: RE | Admit: 2023-05-31 | Payer: BC Managed Care – PPO | Source: Home / Self Care | Admitting: Gastroenterology

## 2023-05-31 ENCOUNTER — Encounter: Admission: RE | Payer: Self-pay | Source: Home / Self Care

## 2023-05-31 SURGERY — COLONOSCOPY
Anesthesia: General

## 2023-06-04 ENCOUNTER — Encounter: Payer: Self-pay | Admitting: Family Medicine

## 2023-06-04 ENCOUNTER — Ambulatory Visit: Admit: 2023-06-04 | Payer: BC Managed Care – PPO | Admitting: Gastroenterology

## 2023-06-04 SURGERY — COLONOSCOPY WITH PROPOFOL
Anesthesia: General

## 2023-06-08 NOTE — Telephone Encounter (Signed)
Not sure if Dr. Mayford Knife reviewed sleep study results with patient. If she has than please disregard. In lab sleep study showed no significant obstructive sleep apnea or oxygen desaturation, overall AHI 4.1/hour. She did have moderate snoring.  We can refer patient to orthodontic dentist if she would like to be fitted for oral appliance to help with this.

## 2023-06-08 NOTE — Telephone Encounter (Signed)
Called patient.  Reviewed information per Ames Dura, NP.  Patient will consider referral to an orthodontic dentist.  She does not want to pursue any treatment options at this time.  Patient verbalized understanding.  Nothing further needed at this time.

## 2023-06-16 ENCOUNTER — Telehealth: Payer: Self-pay

## 2023-06-16 NOTE — Telephone Encounter (Signed)
Left detailed VM, per DPR. Notified patient of sleep study results and left callback number for any questions.

## 2023-06-16 NOTE — Telephone Encounter (Signed)
-----   Message from Quintella Reichert, MD sent at 05/26/2023  8:26 AM EDT ----- Please let patient know that sleep study showed no significant sleep apnea.

## 2023-06-28 ENCOUNTER — Encounter: Payer: Self-pay | Admitting: Family Medicine

## 2023-07-26 ENCOUNTER — Ambulatory Visit: Payer: BC Managed Care – PPO | Admitting: Physician Assistant

## 2023-11-22 ENCOUNTER — Ambulatory Visit (INDEPENDENT_AMBULATORY_CARE_PROVIDER_SITE_OTHER): Payer: BC Managed Care – PPO | Admitting: Family Medicine

## 2023-11-22 ENCOUNTER — Encounter: Payer: Self-pay | Admitting: Family Medicine

## 2023-11-22 VITALS — BP 133/77 | HR 84 | Ht 64.0 in | Wt 184.0 lb

## 2023-11-22 DIAGNOSIS — Z0000 Encounter for general adult medical examination without abnormal findings: Secondary | ICD-10-CM

## 2023-11-22 DIAGNOSIS — Z1231 Encounter for screening mammogram for malignant neoplasm of breast: Secondary | ICD-10-CM

## 2023-11-22 NOTE — Progress Notes (Signed)
BP 133/77   Pulse 84   Ht 5\' 4"  (1.626 m)   Wt 184 lb (83.5 kg)   SpO2 98%   BMI 31.58 kg/m    Subjective:    Patient ID: Julie Patel, female    DOB: Aug 20, 1966, 57 y.o.   MRN: [id number]  HPI: Julie Patel is a 57 y.o. female presenting on 11/22/2023 for comprehensive medical examination. Current medical complaints include:none  She currently lives with:husband Menopausal Symptoms: no  Depression Screen done today and results listed below:     11/22/2023    3:43 PM 02/04/2023   11:37 AM 01/15/2023   11:07 AM 11/20/2022    3:16 PM 11/12/2022    4:23 PM  Depression screen PHQ 2/9  Decreased Interest 0 0 0 0 0  Down, Depressed, Hopeless 0 0 0 0 0  PHQ - 2 Score 0 0 0 0 0  Altered sleeping 0 0 0 0 0  Tired, decreased energy 0 0 0 0 1  Change in appetite 0 0 0 0 0  Feeling bad or failure about yourself  0 0 0 0 0  Trouble concentrating 0 0 0 0 0  Moving slowly or fidgety/restless 0 0 0 0 0  Suicidal thoughts 0 0 0 0 0  PHQ-9 Score 0 0 0 0 1  Difficult doing work/chores Not difficult at all Not difficult at all Not difficult at all Not difficult at all Not difficult at all    Past Medical History:  Past Medical History:  Diagnosis Date   Abnormal uterine bleeding    Anemia    GERD (gastroesophageal reflux disease)    occ   Neck pain    Syncope     Surgical History:  Past Surgical History:  Procedure Laterality Date   ABLATION     DILITATION & CURRETTAGE/HYSTROSCOPY WITH NOVASURE ABLATION N/A 03/08/2017   Procedure: DILATATION & CURETTAGE/HYSTEROSCOPY WITH NOVASURE ABLATION;  Surgeon: Herold Harms, MD;  Location: [facility] ORS;  Service: Gynecology;  Laterality: N/A;   WISDOM TOOTH EXTRACTION      Medications:  Current Outpatient Medications on File Prior to Visit  Medication Sig   ibuprofen (ADVIL) 200 MG tablet Take 400 mg by mouth as needed for mild pain or moderate pain.   multivitamin-iron-minerals-folic acid (CENTRUM) chewable tablet Chew  1 tablet by mouth daily.   No current facility-administered medications on file prior to visit.    Allergies:  Allergies  Allergen Reactions   Codeine Nausea And Vomiting    Social History:  Social History   Socioeconomic History   Marital status: Married    Spouse name: Not on file   Number of children: Not on file   Years of education: Not on file   Highest education level: Not on file  Occupational History   Not on file  Tobacco Use   Smoking status: Never    Passive exposure: Past   Smokeless tobacco: Never  Vaping Use   Vaping status: Never Used  Substance and Sexual Activity   Alcohol use: No   Drug use: No   Sexual activity: Not Currently  Other Topics Concern   Not on file  Social History Narrative   Not on file   Social Drivers of Health   Financial Resource Strain: Not on file  Food Insecurity: Not on file  Transportation Needs: Not on file  Physical Activity: Not on file  Stress: Not on file  Social Connections: Not on file  Intimate  Partner Violence: Not on file   Social History   Tobacco Use  Smoking Status Never   Passive exposure: Past  Smokeless Tobacco Never   Social History   Substance and Sexual Activity  Alcohol Use No    Family History:  Family History  Problem Relation Age of Onset   Alzheimer's disease Mother    Cancer Father        Prostate   Hypertension Brother    Alzheimer's disease Maternal Grandmother    Cancer Maternal Grandfather        Lung   Diabetes Paternal Grandmother    Stroke Paternal Grandfather    Breast cancer Neg Hx    Ovarian cancer Neg Hx    Colon cancer Neg Hx     Past medical history, surgical history, medications, allergies, family history and social history reviewed with patient today and changes made to appropriate areas of the chart.   Review of Systems  Constitutional: Negative.   HENT: Negative.    Eyes:  Positive for blurred vision (reading glasses). Negative for double vision,  photophobia, pain, discharge and redness.  Respiratory: Negative.    Cardiovascular: Negative.   Gastrointestinal: Negative.   Genitourinary:  Positive for dysuria. Negative for flank pain, frequency, hematuria and urgency.  Musculoskeletal: Negative.   Skin: Negative.   Neurological: Negative.   Endo/Heme/Allergies: Negative.   Psychiatric/Behavioral: Negative.     All other ROS negative except what is listed above and in the HPI.      Objective:    BP 133/77   Pulse 84   Ht 5\' 4"  (1.626 m)   Wt 184 lb (83.5 kg)   SpO2 98%   BMI 31.58 kg/m   Wt Readings from Last 3 Encounters:  11/22/23 184 lb (83.5 kg)  05/24/23 180 lb (81.6 kg)  02/26/23 181 lb 12.8 oz (82.5 kg)    Physical Exam Vitals and nursing note reviewed.  Constitutional:      General: She is not in acute distress.    Appearance: Normal appearance. She is not ill-appearing, toxic-appearing or diaphoretic.  HENT:     Head: Normocephalic and atraumatic.     Right Ear: Tympanic membrane, ear canal and external ear normal. There is no impacted cerumen.     Left Ear: Tympanic membrane, ear canal and external ear normal. There is no impacted cerumen.     Nose: Nose normal. No congestion or rhinorrhea.     Mouth/Throat:     Mouth: Mucous membranes are moist.     Pharynx: Oropharynx is clear. No oropharyngeal exudate or posterior oropharyngeal erythema.  Eyes:     General: No scleral icterus.       Right eye: No discharge.        Left eye: No discharge.     Extraocular Movements: Extraocular movements intact.     Conjunctiva/sclera: Conjunctivae normal.     Pupils: Pupils are equal, round, and reactive to light.  Neck:     Vascular: No carotid bruit.  Cardiovascular:     Rate and Rhythm: Normal rate and regular rhythm.     Pulses: Normal pulses.     Heart sounds: No murmur heard.    No friction rub. No gallop.  Pulmonary:     Effort: Pulmonary effort is normal. No respiratory distress.     Breath sounds:  Normal breath sounds. No stridor. No wheezing, rhonchi or rales.  Chest:     Chest wall: No tenderness.  Abdominal:  General: Abdomen is flat. Bowel sounds are normal. There is no distension.     Palpations: Abdomen is soft. There is no mass.     Tenderness: There is no abdominal tenderness. There is no right CVA tenderness, left CVA tenderness, guarding or rebound.     Hernia: No hernia is present.  Genitourinary:    Comments: Breast and pelvic exams deferred with shared decision making Musculoskeletal:        General: No swelling, tenderness, deformity or signs of injury.     Cervical back: Normal range of motion and neck supple. No rigidity. No muscular tenderness.     Right lower leg: No edema.     Left lower leg: No edema.  Lymphadenopathy:     Cervical: No cervical adenopathy.  Skin:    General: Skin is warm and dry.     Capillary Refill: Capillary refill takes less than 2 seconds.     Coloration: Skin is not jaundiced or pale.     Findings: No bruising, erythema, lesion or rash.  Neurological:     General: No focal deficit present.     Mental Status: She is alert and oriented to person, place, and time. Mental status is at baseline.     Cranial Nerves: No cranial nerve deficit.     Sensory: No sensory deficit.     Motor: No weakness.     Coordination: Coordination normal.     Gait: Gait normal.     Deep Tendon Reflexes: Reflexes normal.  Psychiatric:        Mood and Affect: Mood normal.        Behavior: Behavior normal.        Thought Content: Thought content normal.        Judgment: Judgment normal.     Results for orders placed or performed in visit on 06/04/23  HM COLONOSCOPY   Collection Time: 05/19/23 12:00 AM  Result Value Ref Range   HM Colonoscopy See Report (in chart) See Report (in chart), Patient Reported      Assessment & Plan:   Problem List Items Addressed This Visit   None Visit Diagnoses       Routine general medical examination at a  health care facility    -  Primary   Vaccines up to date. Screening labs checked today. Pap, mammo and colonoscopy up to date. Continue diet and exercise. Call with any concerns.   Relevant Orders   CBC with Differential/Platelet   Comprehensive metabolic panel   Lipid Panel w/o Chol/HDL Ratio   TSH     Encounter for screening mammogram for malignant neoplasm of breast       Due in June. Ordered today.   Relevant Orders   MM 3D SCREENING MAMMOGRAM BILATERAL BREAST        Follow up plan: Return in about 1 year (around 11/21/2024) for physical.   LABORATORY TESTING:  - Pap smear: up to date  IMMUNIZATIONS:   - Tdap: Tetanus vaccination status reviewed: last tetanus booster within 10 years. - Influenza: Up to date - Pneumovax: Not applicable - Prevnar: Not applicable - COVID: Refused - Shingrix vaccine: Refused  SCREENING: -Mammogram: Up to date  - Colonoscopy: Up to date   PATIENT COUNSELING:   Advised to take 1 mg of folate supplement per day if capable of pregnancy.   Sexuality: Discussed sexually transmitted diseases, partner selection, use of condoms, avoidance of unintended pregnancy  and contraceptive alternatives.   Advised to avoid cigarette  smoking.  I discussed with the patient that most people either abstain from alcohol or drink within safe limits (<=14/week and <=4 drinks/occasion for males, <=7/weeks and <= 3 drinks/occasion for females) and that the risk for alcohol disorders and other health effects rises proportionally with the number of drinks per week and how often a drinker exceeds daily limits.  Discussed cessation/primary prevention of drug use and availability of treatment for abuse.   Diet: Encouraged to adjust caloric intake to maintain  or achieve ideal body weight, to reduce intake of dietary saturated fat and total fat, to limit sodium intake by avoiding high sodium foods and not adding table salt, and to maintain adequate dietary potassium and  calcium preferably from fresh fruits, vegetables, and low-fat dairy products.    stressed the importance of regular exercise  Injury prevention: Discussed safety belts, safety helmets, smoke detector, smoking near bedding or upholstery.   Dental health: Discussed importance of regular tooth brushing, flossing, and dental visits.    NEXT PREVENTATIVE PHYSICAL DUE IN 1 YEAR. Return in about 1 year (around 11/21/2024) for physical.

## 2023-11-23 LAB — CBC WITH DIFFERENTIAL/PLATELET
Basophils Absolute: 0 10*3/uL (ref 0.0–0.2)
Basos: 0 %
EOS (ABSOLUTE): 0.1 10*3/uL (ref 0.0–0.4)
Eos: 1 %
Hematocrit: 40.1 % (ref 34.0–46.6)
Hemoglobin: 12.9 g/dL (ref 11.1–15.9)
Immature Grans (Abs): 0 10*3/uL (ref 0.0–0.1)
Immature Granulocytes: 0 %
Lymphocytes Absolute: 1.5 10*3/uL (ref 0.7–3.1)
Lymphs: 29 %
MCH: 28.9 pg (ref 26.6–33.0)
MCHC: 32.2 g/dL (ref 31.5–35.7)
MCV: 90 fL (ref 79–97)
Monocytes Absolute: 0.4 10*3/uL (ref 0.1–0.9)
Monocytes: 7 %
Neutrophils Absolute: 3.4 10*3/uL (ref 1.4–7.0)
Neutrophils: 63 %
Platelets: 233 10*3/uL (ref 150–450)
RBC: 4.46 x10E6/uL (ref 3.77–5.28)
RDW: 12.9 % (ref 11.7–15.4)
WBC: 5.4 10*3/uL (ref 3.4–10.8)

## 2023-11-23 LAB — COMPREHENSIVE METABOLIC PANEL
ALT: 20 [IU]/L (ref 0–32)
AST: 23 [IU]/L (ref 0–40)
Albumin: 4.1 g/dL (ref 3.8–4.9)
Alkaline Phosphatase: 66 [IU]/L (ref 44–121)
BUN/Creatinine Ratio: 17 (ref 9–23)
BUN: 15 mg/dL (ref 6–24)
Bilirubin Total: 0.7 mg/dL (ref 0.0–1.2)
CO2: 22 mmol/L (ref 20–29)
Calcium: 9.6 mg/dL (ref 8.7–10.2)
Chloride: 101 mmol/L (ref 96–106)
Creatinine, Ser: 0.9 mg/dL (ref 0.57–1.00)
Globulin, Total: 2.7 g/dL (ref 1.5–4.5)
Glucose: 110 mg/dL — ABNORMAL HIGH (ref 70–99)
Potassium: 3.9 mmol/L (ref 3.5–5.2)
Sodium: 139 mmol/L (ref 134–144)
Total Protein: 6.8 g/dL (ref 6.0–8.5)
eGFR: 75 mL/min/{1.73_m2} (ref >59)

## 2023-11-23 LAB — LIPID PANEL W/O CHOL/HDL RATIO
Cholesterol, Total: 264 mg/dL — ABNORMAL HIGH (ref 100–199)
HDL: 44 mg/dL (ref >39)
LDL Chol Calc (NIH): 175 mg/dL — ABNORMAL HIGH (ref 0–99)
Triglycerides: 240 mg/dL — ABNORMAL HIGH (ref 0–149)
VLDL Cholesterol Cal: 45 mg/dL — ABNORMAL HIGH (ref 5–40)

## 2023-11-23 LAB — TSH: TSH: 1.35 u[IU]/mL (ref 0.450–4.500)

## 2024-02-15 ENCOUNTER — Ambulatory Visit: Admitting: Nurse Practitioner

## 2024-02-15 ENCOUNTER — Encounter: Payer: Self-pay | Admitting: Nurse Practitioner

## 2024-02-15 ENCOUNTER — Ambulatory Visit: Payer: Self-pay | Admitting: Nurse Practitioner

## 2024-02-15 VITALS — BP 131/81 | HR 70 | Temp 98.1°F | Ht 64.0 in | Wt 186.0 lb

## 2024-02-15 DIAGNOSIS — T7840XA Allergy, unspecified, initial encounter: Secondary | ICD-10-CM | POA: Diagnosis not present

## 2024-02-15 MED ORDER — FLUTICASONE PROPIONATE 50 MCG/ACT NA SUSP: 2.0000 | Freq: Every day | NASAL | 6 refills | Status: AC

## 2024-02-15 NOTE — Patient Instructions (Addendum)
 Zyrtec or Allegra- OTC antihistamines   Please call to schedule your mammogram and/or bone density: [facility]  Address: [address], [place], [place] [address] Phone: [phone]  [facility] at [facility] [address] [place],  [place]  [address] Phone: [phone]

## 2024-02-15 NOTE — Progress Notes (Signed)
 BP 131/81 (BP Location: Left Arm, Patient Position: Sitting, Cuff Size: Large)   Pulse 70   Temp 98.1 F (36.7 C) (Oral)   Ht 5\' 4"  (1.626 m)   Wt 186 lb (84.4 kg)   SpO2 99%   BMI 31.93 kg/m    Subjective:    Patient ID: Julie Patel, female    DOB: [birth year], 58 y.o.   MRN: [id number]  HPI: Julie Patel is a 58 y.o. female  Chief Complaint  Patient presents with   Nasal Congestion    OTC medications are ineffective    Sinusitis   watery eyes   Patient states she is congested, runny nose, watery eyes.  She has been taking sudafed but that didn't help her very much.  She did cold ez but that didn't help either.  Denies fever, SOB, HA, ear pain, sore throat.      Relevant past medical, surgical, family and social history reviewed and updated as indicated. Interim medical history since our last visit reviewed. Allergies and medications reviewed and updated.  Review of Systems  Constitutional:  Negative for fever.  HENT:  Positive for congestion. Negative for ear pain and sore throat.   Eyes:  Positive for discharge.  Respiratory:  Negative for shortness of breath.   Neurological:  Negative for headaches.    Per HPI unless specifically indicated above     Objective:    BP 131/81 (BP Location: Left Arm, Patient Position: Sitting, Cuff Size: Large)   Pulse 70   Temp 98.1 F (36.7 C) (Oral)   Ht 5\' 4"  (1.626 m)   Wt 186 lb (84.4 kg)   SpO2 99%   BMI 31.93 kg/m   Wt Readings from Last 3 Encounters:  02/15/24 186 lb (84.4 kg)  11/22/23 184 lb (83.5 kg)  05/24/23 180 lb (81.6 kg)    Physical Exam Vitals and nursing note reviewed.  Constitutional:      General: She is not in acute distress.    Appearance: Normal appearance. She is normal weight. She is not ill-appearing, toxic-appearing or diaphoretic.  HENT:     Head: Normocephalic.     Right Ear: External ear normal. A middle ear effusion is present.     Left Ear: External ear normal. A middle  ear effusion is present.     Nose: Congestion and rhinorrhea present.     Mouth/Throat:     Mouth: Mucous membranes are moist.     Pharynx: Oropharynx is clear. Posterior oropharyngeal erythema present. No oropharyngeal exudate.  Eyes:     General:        Right eye: No discharge.        Left eye: No discharge.     Extraocular Movements: Extraocular movements intact.     Conjunctiva/sclera: Conjunctivae normal.     Pupils: Pupils are equal, round, and reactive to light.  Cardiovascular:     Rate and Rhythm: Normal rate and regular rhythm.     Heart sounds: No murmur heard. Pulmonary:     Effort: Pulmonary effort is normal. No respiratory distress.     Breath sounds: Normal breath sounds. No wheezing or rales.  Musculoskeletal:     Cervical back: Normal range of motion and neck supple.  Skin:    General: Skin is warm and dry.     Capillary Refill: Capillary refill takes less than 2 seconds.  Neurological:     General: No focal deficit present.     Mental Status: She  is alert and oriented to person, place, and time. Mental status is at baseline.  Psychiatric:        Mood and Affect: Mood normal.        Behavior: Behavior normal.        Thought Content: Thought content normal.        Judgment: Judgment normal.     Results for orders placed or performed in visit on 11/22/23  CBC with Differential/Platelet   Collection Time: 11/22/23  3:45 PM  Result Value Ref Range   WBC 5.4 3.4 - 10.8 x10E3/uL   RBC 4.46 3.77 - 5.28 x10E6/uL   Hemoglobin 12.9 11.1 - 15.9 g/dL   Hematocrit 29.5 28.4 - 46.6 %   MCV 90 79 - 97 fL   MCH 28.9 26.6 - 33.0 pg   MCHC 32.2 31.5 - 35.7 g/dL   RDW 13.2 44.0 - 10.2 %   Platelets 233 150 - 450 x10E3/uL   Neutrophils 63 Not Estab. %   Lymphs 29 Not Estab. %   Monocytes 7 Not Estab. %   Eos 1 Not Estab. %   Basos 0 Not Estab. %   Neutrophils Absolute 3.4 1.4 - 7.0 x10E3/uL   Lymphocytes Absolute 1.5 0.7 - 3.1 x10E3/uL   Monocytes Absolute 0.4 0.1 -  0.9 x10E3/uL   EOS (ABSOLUTE) 0.1 0.0 - 0.4 x10E3/uL   Basophils Absolute 0.0 0.0 - 0.2 x10E3/uL   Immature Granulocytes 0 Not Estab. %   Immature Grans (Abs) 0.0 0.0 - 0.1 x10E3/uL  Comprehensive metabolic panel   Collection Time: 11/22/23  3:45 PM  Result Value Ref Range   Glucose 110 (H) 70 - 99 mg/dL   BUN 15 6 - 24 mg/dL   Creatinine, Ser 7.25 0.57 - 1.00 mg/dL   eGFR 75 >36 UY/QIH/4.74   BUN/Creatinine Ratio 17 9 - 23   Sodium 139 134 - 144 mmol/L   Potassium 3.9 3.5 - 5.2 mmol/L   Chloride 101 96 - 106 mmol/L   CO2 22 20 - 29 mmol/L   Calcium 9.6 8.7 - 10.2 mg/dL   Total Protein 6.8 6.0 - 8.5 g/dL   Albumin 4.1 3.8 - 4.9 g/dL   Globulin, Total 2.7 1.5 - 4.5 g/dL   Bilirubin Total 0.7 0.0 - 1.2 mg/dL   Alkaline Phosphatase 66 44 - 121 IU/L   AST 23 0 - 40 IU/L   ALT 20 0 - 32 IU/L  Lipid Panel w/o Chol/HDL Ratio   Collection Time: 11/22/23  3:45 PM  Result Value Ref Range   Cholesterol, Total 264 (H) 100 - 199 mg/dL   Triglycerides 259 (H) 0 - 149 mg/dL   HDL 44 >56 mg/dL   VLDL Cholesterol Cal 45 (H) 5 - 40 mg/dL   LDL Chol Calc (NIH) 387 (H) 0 - 99 mg/dL  TSH   Collection Time: 11/22/23  3:45 PM  Result Value Ref Range   TSH 1.350 0.450 - 4.500 uIU/mL      Assessment & Plan:   Problem List Items Addressed This Visit   None Visit Diagnoses       Allergy, initial encounter    -  Primary   Recommend over the counter antihistamine and flonase.  Recommend using a neti pot.  Follow up if symptoms are not improved.        Follow up plan: Return if symptoms worsen or fail to improve.

## 2024-03-15 ENCOUNTER — Ambulatory Visit: Admitting: Family Medicine

## 2024-03-16 ENCOUNTER — Ambulatory Visit: Admitting: Family Medicine

## 2024-05-18 ENCOUNTER — Telehealth: Payer: Self-pay

## 2024-05-18 NOTE — Telephone Encounter (Signed)
 Copied from CRM [id number]. Topic: General - Other >> May 17, 2024  4:19 PM Sophia H wrote: Reason for CRM: patient is calling in regarding her shot record. Wants to know when the last time she had a tetanus shot was, please advise. Stated she was cleaning out barbed wire and had some rusty metal break her skin. Declined to speak with nurse triage, states it is not bothering her.  # [phone]

## 2024-05-18 NOTE — Telephone Encounter (Signed)
 Returned call to patient and informed she is not due for vaccine. She furthers states it continues to not cause pain or issues but understands if concerns arise to please call the office.

## 2024-05-25 ENCOUNTER — Encounter: Payer: Self-pay | Admitting: Nurse Practitioner

## 2024-05-25 ENCOUNTER — Ambulatory Visit: Attending: Nurse Practitioner | Admitting: Physician Assistant

## 2024-05-25 VITALS — BP 122/83 | HR 74 | Ht 64.0 in | Wt 184.4 lb

## 2024-05-25 DIAGNOSIS — E782 Mixed hyperlipidemia: Secondary | ICD-10-CM | POA: Diagnosis not present

## 2024-05-25 DIAGNOSIS — R55 Syncope and collapse: Secondary | ICD-10-CM

## 2024-05-25 DIAGNOSIS — R0683 Snoring: Secondary | ICD-10-CM | POA: Diagnosis not present

## 2024-05-25 NOTE — Progress Notes (Signed)
 Cardiology Office Note    Date:  05/25/2024   ID:  Julie Patel, DOB [birth year], MRN [id number]  PCP:  Julie Dupre, DO  Cardiologist:  Julie Delton, MD  Electrophysiologist:  None   Chief Complaint: Follow up   History of Present Illness:   Julie Patel is a 57 y.o. female with history of GERD, hyperlipidemia, and recurrent syncope who presents for follow-up on syncope.    Patient was initially evaluated by Dr. Junnie Patel 11/2022 for the evaluation of syncope at the request of her PCP.  ZIO monitor 01/2023 showed multiple sinus pauses lasting up to 17 seconds. Echo done 01/2023 showed EF 60 to 65% and mild MR.  It was recommended that she be evaluated in the ER.  She was evaluated by EP while admitted who deemed pauses to be neurogenic and did not feel that there was a role for PPM given pauses were nocturnal.     She was most recently seen in outpatient follow-up 01/26/2023 and referred for sleep study. Sleep study was completed 05/2023 and was normal without sign of OSA.  Patient presents to clinic today for overdue 1 year follow-up.  She is overall doing well from a cardiac perspective.  She denies any further episodes of syncope.  She denies chest pain, palpitations, lightheadedness, dizziness, and lower extremity swelling.  She has been working to decrease her cholesterol with dietary modifications.  She previously was eating eggs every day for breakfast which she has cut back on.  Labs independently reviewed: 11/22/2023-TC 264, TG 240, HDL 44, LDL 175, BUN 15, creatinine 0.9, K3.9, NA 139, normal LFTs, Hgb 12.9, HCT 40.1, platelets 233  Objective   Past Medical History:  Diagnosis Date   Abnormal uterine bleeding    Anemia    GERD (gastroesophageal reflux disease)    occ   Neck pain    Syncope     Current Medications: Current Meds  Medication Sig   fluticasone  (FLONASE ) 50 MCG/ACT nasal spray Place 2 sprays into both nostrils daily.   ibuprofen   (ADVIL ) 200 MG tablet Take 400 mg by mouth as needed for mild pain or moderate pain.   multivitamin-iron -minerals-folic acid (CENTRUM) chewable tablet Chew 1 tablet by mouth daily.    Allergies:   Codeine   Social History   Socioeconomic History   Marital status: Married    Spouse name: Not on file   Number of children: Not on file   Years of education: Not on file   Highest education level: Not on file  Occupational History   Not on file  Tobacco Use   Smoking status: Never    Passive exposure: Past   Smokeless tobacco: Never  Vaping Use   Vaping status: Never Used  Substance and Sexual Activity   Alcohol use: No   Drug use: No   Sexual activity: Not Currently  Other Topics Concern   Not on file  Social History Narrative   Not on file   Social Drivers of Health   Financial Resource Strain: Not on file  Food Insecurity: Not on file  Transportation Needs: Not on file  Physical Activity: Not on file  Stress: Not on file  Social Connections: Not on file     Family History:  The patient's family history includes Alzheimer's disease in her maternal grandmother and mother; Cancer in her father and maternal grandfather; Diabetes in her paternal grandmother; Hypertension in her brother; Stroke in her paternal grandfather. There is no history of  Breast cancer, Ovarian cancer, or Colon cancer.  ROS:   12-point review of systems is negative unless otherwise noted in the HPI.   EKGs/Other Studies Reviewed:    Studies reviewed were summarized above. The additional studies were reviewed today: 01/19/2023 Echo complete  1. Left ventricular ejection fraction, by estimation, is 60 to 65%. The  left ventricle has normal function. The left ventricle has no regional  wall motion abnormalities. Left ventricular diastolic parameters are  indeterminate. The average left  ventricular global longitudinal strain is -21.1 %.   2. Right ventricular systolic function is normal. The right  ventricular  size is normal. Tricuspid regurgitation signal is inadequate for assessing  PA pressure.   3. The mitral valve is normal in structure. Mild mitral valve  regurgitation. No evidence of mitral stenosis.   4. The aortic valve is tricuspid. Aortic valve regurgitation is not  visualized. No aortic stenosis is present.   5. The inferior vena cava is normal in size with greater than 50%  respiratory variability, suggesting right atrial pressure of 3 mmHg.   01/14/2023 Zio monitor Multiple sinus pauses noted, lasting 17, 14 seconds. Likely reason for syncope. ED precautions, needs to be urgently evaluated by EP for pacemaker consideration.  EKG:  EKG personally reviewed by me today EKG Interpretation Date/Time:  Thursday May 25 2024 15:45:30 EDT Ventricular Rate:  74 PR Interval:  138 QRS Duration:  82 QT Interval:  372 QTC Calculation: 412 R Axis:   84  Text Interpretation: Normal sinus rhythm When compared with ECG of 15-Jan-2023 12:10, No significant change was found Confirmed by Gildardo Labrador ([id number]) on 05/25/2024 3:48:04 PM  PHYSICAL EXAM:    VS:  BP 122/83 (BP Location: Left Arm, Patient Position: Sitting, Cuff Size: Normal)   Pulse 74   Ht 5' 4 (1.626 m)   Wt 184 lb 6.4 oz (83.6 kg)   SpO2 98%   BMI 31.65 kg/m   BMI: Body mass index is 31.65 kg/m.  Physical Exam Vitals and nursing note reviewed.  Constitutional:      General: She is not in acute distress.    Appearance: Normal appearance.   Cardiovascular:     Rate and Rhythm: Normal rate and regular rhythm.     Heart sounds: No murmur heard. Pulmonary:     Effort: Pulmonary effort is normal. No respiratory distress.     Breath sounds: No wheezing or rales.   Musculoskeletal:     Right lower leg: No edema.     Left lower leg: No edema.   Skin:    General: Skin is warm and dry.   Neurological:     General: No focal deficit present.     Mental Status: She is alert and oriented to person, place, and  time. Mental status is at baseline.   Psychiatric:        Mood and Affect: Mood normal.        Behavior: Behavior normal.     Wt Readings from Last 3 Encounters:  05/25/24 184 lb 6.4 oz (83.6 kg)  02/15/24 186 lb (84.4 kg)  11/22/23 184 lb (83.5 kg)       ASSESSMENT & PLAN:   Syncope History of nocturnal sinus pause Snoring - History of syncope late 2023. Echo with normal EF.  ZIO monitor showed multiple sinus pauses up to 17 seconds. These were nocturnal. She was evaluated by EP and etiology deemed to be vasovagal with no indication for PPM. She was referred for  OSA evaluation which was negative. No recurrence of syncope. Denies lightheadedness and dizziness. Overall asymptomatic from a cardiac perspective. No further testing indicated at this time.  Hyperlipidemia - Most recent lipid panel with LDL 175, improved from 215. Recommended continued dietary efforts. She would like to avoid statins.  Disposition: F/u with Dr. Junnie Patel or an APP as needed.   Medication Adjustments/Labs and Tests Ordered: Current medicines are reviewed at length with the patient today.  Concerns regarding medicines are outlined above. Medication changes, Labs and Tests ordered today are summarized above and listed in the Patient Instructions accessible in Encounters.   Beather Liming, PA-C 05/25/2024 4:15 PM     [facility] -  [address] [place], [place] [address] [phone]

## 2024-05-25 NOTE — Patient Instructions (Signed)
 Medication Instructions:  Your physician recommends that you continue on your current medications as directed. Please refer to the Current Medication list given to you today.   *If you need a refill on your cardiac medications before your next appointment, please call your pharmacy*  Lab Work: None ordered at this time  If you have labs (blood work) drawn today and your tests are completely normal, you will receive your results only by: MyChart Message (if you have MyChart) OR A paper copy in the mail If you have any lab test that is abnormal or we need to change your treatment, we will call you to review the results.  Testing/Procedures: None ordered at this time   Follow-Up: At [facility], you and your health needs are our priority.  As part of our continuing mission to provide you with exceptional heart care, our providers are all part of one team.  This team includes your primary Cardiologist (physician) and Advanced Practice Providers or APPs (Physician Assistants and Nurse Practitioners) who all work together to provide you with the care you need, when you need it.  Your next appointment:    Follow up as needed for worsening symptoms.   Provider:   You may see Constancia Delton, MD or one of the following Advanced Practice Providers on your designated Care Team:   Laneta Pintos, NP Gildardo Labrador, PA-C Varney Gentleman, PA-C Cadence [place], PA-C Ronald Cockayne, NP Morey Ar, NP    We recommend signing up for the patient portal called MyChart.  Sign up information is provided on this After Visit Summary.  MyChart is used to connect with patients for Virtual Visits (Telemedicine).  Patients are able to view lab/test results, encounter notes, upcoming appointments, etc.  Non-urgent messages can be sent to your provider as well.   To learn more about what you can do with MyChart, go to [email or web address].

## 2024-09-21 ENCOUNTER — Telehealth: Payer: Self-pay | Admitting: Family Medicine

## 2024-09-21 NOTE — Telephone Encounter (Signed)
 Patient came into office with husband. Stated she had an episode of passing out with elevated blood pressure this morning. Patient is persistent that I ask her provider to see her now. Suggested she seek the ER or urgent care. Providers CMA also verbally advised her to ER or urgent care. Patient requesting to inform her provider to ask if she can be a work in. Please advise. [phone]

## 2024-09-26 NOTE — Telephone Encounter (Signed)
 Patient sent at urgent care 09/22/24.

## 2024-11-23 ENCOUNTER — Encounter: Payer: Self-pay | Admitting: Family Medicine

## 2024-11-23 ENCOUNTER — Other Ambulatory Visit (HOSPITAL_COMMUNITY)
Admission: RE | Admit: 2024-11-23 | Discharge: 2024-11-23 | Disposition: A | Discharge status: Home / Self Care | Source: Ambulatory Visit | Attending: Family Medicine | Admitting: Family Medicine

## 2024-11-23 ENCOUNTER — Ambulatory Visit: Payer: Self-pay | Admitting: Family Medicine

## 2024-11-23 VITALS — Ht 64.0 in | Wt 183.2 lb

## 2024-11-23 DIAGNOSIS — Z23 Encounter for immunization: Secondary | ICD-10-CM | POA: Diagnosis not present

## 2024-11-23 DIAGNOSIS — Z1231 Encounter for screening mammogram for malignant neoplasm of breast: Secondary | ICD-10-CM | POA: Diagnosis not present

## 2024-11-23 DIAGNOSIS — Z0000 Encounter for general adult medical examination without abnormal findings: Secondary | ICD-10-CM

## 2024-11-23 DIAGNOSIS — R55 Syncope and collapse: Secondary | ICD-10-CM | POA: Diagnosis not present

## 2024-11-23 NOTE — Progress Notes (Signed)
 Ht 5' 4 (1.626 m)   Wt 183 lb 3.2 oz (83.1 kg)   BMI 31.45 kg/m    Subjective:    Patient ID: Julie Patel, female    DOB: Feb 02, 1966, 58 y.o.   MRN: [id number]  HPI: Julie Patel is a 58 y.o. female presenting on 11/23/2024 for comprehensive medical examination. Current medical complaints include:  Memory is not doing as well as it had been in the past. Had another episode where she passed out in October. Went to UC but didn't go to the ER. Had cardiac work-up in 2024 and everything looked good. Unable to see if she did the EEGs or MRI- she thinks that she did. Did not follow up with neurology. She is otherwise doing OK with no other concerns or complaints at this time.   She currently lives with: husband Menopausal Symptoms: no  Depression Screen done today and results listed below:     11/23/2024    3:29 PM 02/15/2024    8:35 AM 11/22/2023    3:43 PM 02/04/2023   11:37 AM 01/15/2023   11:07 AM  Depression screen PHQ 2/9  Decreased Interest 0 0 0 0 0  Down, Depressed, Hopeless 0 0 0 0 0  PHQ - 2 Score 0 0 0 0 0  Altered sleeping 0 0 0 0 0  Tired, decreased energy 0 0 0 0 0  Change in appetite 0 0 0 0 0  Feeling bad or failure about yourself  0 0 0 0 0  Trouble concentrating 0 0 0 0 0  Moving slowly or fidgety/restless 0 0 0 0 0  Suicidal thoughts 0 0 0 0 0  PHQ-9 Score 0 0  0  0  0   Difficult doing work/chores   Not difficult at all Not difficult at all Not difficult at all     Data saved with a previous flowsheet row definition    Past Medical History:  Past Medical History:  Diagnosis Date   Abnormal uterine bleeding    Anemia    GERD (gastroesophageal reflux disease)    occ   Neck pain    Syncope     Surgical History:  Past Surgical History:  Procedure Laterality Date   ABLATION     DILITATION & CURRETTAGE/HYSTROSCOPY WITH NOVASURE ABLATION N/A 03/08/2017   Procedure: DILATATION & CURETTAGE/HYSTEROSCOPY WITH NOVASURE ABLATION;  Surgeon:  Gladis DELENA Dollar, MD;  Location: [facility] ORS;  Service: Gynecology;  Laterality: N/A;   WISDOM TOOTH EXTRACTION      Medications:  Current Outpatient Medications on File Prior to Visit  Medication Sig   ferrous sulfate  325 (65 FE) MG tablet Take 325 mg by mouth.   ibuprofen  (ADVIL ) 200 MG tablet Take 400 mg by mouth as needed for mild pain or moderate pain.   multivitamin-iron -minerals-folic acid (CENTRUM) chewable tablet Chew 1 tablet by mouth daily.   fluticasone  (FLONASE ) 50 MCG/ACT nasal spray Place 2 sprays into both nostrils daily. (Patient not taking: Reported on 11/23/2024)   No current facility-administered medications on file prior to visit.    Allergies:  Allergies[1]  Social History:  Social History   Socioeconomic History   Marital status: Married    Spouse name: Not on file   Number of children: Not on file   Years of education: Not on file   Highest education level: Not on file  Occupational History   Not on file  Tobacco Use   Smoking status: Never  Passive exposure: Past   Smokeless tobacco: Never  Vaping Use   Vaping status: Never Used  Substance and Sexual Activity   Alcohol use: No   Drug use: No   Sexual activity: Not Currently  Other Topics Concern   Not on file  Social History Narrative   Not on file   Social Drivers of Health   Tobacco Use: Low Risk (11/23/2024)   Patient History    Smoking Tobacco Use: Never    Smokeless Tobacco Use: Never    Passive Exposure: Past  Financial Resource Strain: Low Risk (11/23/2024)   Overall Financial Resource Strain (CARDIA)    Difficulty of Paying Living Expenses: Not hard at all  Food Insecurity: No Food Insecurity (11/23/2024)   Epic    Worried About Radiation Protection Practitioner of Food in the Last Year: Never true    Ran Out of Food in the Last Year: Never true  Transportation Needs: No Transportation Needs (11/23/2024)   Epic    Lack of Transportation (Medical): No    Lack of Transportation (Non-Medical):  No  Physical Activity: Insufficiently Active (11/23/2024)   Exercise Vital Sign    Days of Exercise per Week: 5 days    Minutes of Exercise per Session: 20 min  Stress: No Stress Concern Present (11/23/2024)   [employer] of Occupational Health - Occupational Stress Questionnaire    Feeling of Stress: Not at all  Social Connections: Socially Isolated (11/23/2024)   Social Connection and Isolation Panel    Frequency of Communication with Friends and Family: Once a week    Frequency of Social Gatherings with Friends and Family: Once a week    Attends Religious Services: Never    Database Administrator or Organizations: No    Attends Banker Meetings: Never    Marital Status: Married  Catering Manager Violence: Not At Risk (11/23/2024)   Epic    Fear of Current or Ex-Partner: No    Emotionally Abused: No    Physically Abused: No    Sexually Abused: No  Depression (PHQ2-9): Low Risk (11/23/2024)   Depression (PHQ2-9)    PHQ-2 Score: 0  Alcohol Screen: Low Risk (11/23/2024)   Alcohol Screen    Last Alcohol Screening Score (AUDIT): 0  Housing: Low Risk (11/23/2024)   Epic    Unable to Pay for Housing in the Last Year: No    Number of Times Moved in the Last Year: 0    Homeless in the Last Year: No  Utilities: Not At Risk (11/23/2024)   Epic    Threatened with loss of utilities: No  Health Literacy: Adequate Health Literacy (11/23/2024)   B1300 Health Literacy    Frequency of need for help with medical instructions: Never   Tobacco Use History[2] Social History   Substance and Sexual Activity  Alcohol Use No    Family History:  Family History  Problem Relation Age of Onset   Alzheimer's disease Mother    Cancer Father        Prostate   Hypertension Brother    Alzheimer's disease Maternal Grandmother    Cancer Maternal Grandfather        Lung   Diabetes Paternal Grandmother    Stroke Paternal Grandfather    Breast cancer Neg Hx    Ovarian cancer  Neg Hx    Colon cancer Neg Hx     Past medical history, surgical history, medications, allergies, family history and social history reviewed with patient today and changes  made to appropriate areas of the chart.   Review of Systems  Constitutional: Negative.   HENT: Negative.    Eyes: Negative.   Respiratory: Negative.    Cardiovascular: Negative.   Gastrointestinal: Negative.   Genitourinary: Negative.   Musculoskeletal: Negative.   Skin: Negative.   Neurological:  Positive for loss of consciousness. Negative for dizziness, tingling, tremors, sensory change, speech change, focal weakness, seizures, weakness and headaches.  Endo/Heme/Allergies: Negative.   Psychiatric/Behavioral:  Positive for memory loss. Negative for depression, hallucinations, substance abuse and suicidal ideas. The patient is not nervous/anxious and does not have insomnia.    All other ROS negative except what is listed above and in the HPI.      Objective:    Ht 5' 4 (1.626 m)   Wt 183 lb 3.2 oz (83.1 kg)   BMI 31.45 kg/m   Wt Readings from Last 3 Encounters:  11/23/24 183 lb 3.2 oz (83.1 kg)  05/25/24 184 lb 6.4 oz (83.6 kg)  02/15/24 186 lb (84.4 kg)    Physical Exam Vitals and nursing note reviewed.  Constitutional:      General: She is not in acute distress.    Appearance: Normal appearance. She is not ill-appearing, toxic-appearing or diaphoretic.  HENT:     Head: Normocephalic and atraumatic.     Right Ear: Tympanic membrane, ear canal and external ear normal. There is no impacted cerumen.     Left Ear: Tympanic membrane, ear canal and external ear normal. There is no impacted cerumen.     Nose: Nose normal. No congestion or rhinorrhea.     Mouth/Throat:     Mouth: Mucous membranes are moist.     Pharynx: Oropharynx is clear. No oropharyngeal exudate or posterior oropharyngeal erythema.  Eyes:     General: No scleral icterus.       Right eye: No discharge.        Left eye: No discharge.      Extraocular Movements: Extraocular movements intact.     Conjunctiva/sclera: Conjunctivae normal.     Pupils: Pupils are equal, round, and reactive to light.  Neck:     Vascular: No carotid bruit.  Cardiovascular:     Rate and Rhythm: Normal rate and regular rhythm.     Pulses: Normal pulses.     Heart sounds: No murmur heard.    No friction rub. No gallop.  Pulmonary:     Effort: Pulmonary effort is normal. No respiratory distress.     Breath sounds: Normal breath sounds. No stridor. No wheezing, rhonchi or rales.  Chest:     Chest wall: No tenderness.  Abdominal:     General: Abdomen is flat. Bowel sounds are normal. There is no distension.     Palpations: Abdomen is soft. There is no mass.     Tenderness: There is no abdominal tenderness. There is no right CVA tenderness, left CVA tenderness, guarding or rebound.     Hernia: No hernia is present.  Genitourinary:    Comments: Breast and pelvic exams deferred with shared decision making Musculoskeletal:        General: No swelling, tenderness, deformity or signs of injury.     Cervical back: Normal range of motion and neck supple. No rigidity. No muscular tenderness.     Right lower leg: No edema.     Left lower leg: No edema.  Lymphadenopathy:     Cervical: No cervical adenopathy.  Skin:    General: Skin is warm and  dry.     Capillary Refill: Capillary refill takes less than 2 seconds.     Coloration: Skin is not jaundiced or pale.     Findings: No bruising, erythema, lesion or rash.  Neurological:     General: No focal deficit present.     Mental Status: She is alert and oriented to person, place, and time. Mental status is at baseline.     Cranial Nerves: No cranial nerve deficit.     Sensory: No sensory deficit.     Motor: No weakness.     Coordination: Coordination normal.     Gait: Gait normal.     Deep Tendon Reflexes: Reflexes normal.  Psychiatric:        Mood and Affect: Mood normal.        Behavior:  Behavior normal.        Thought Content: Thought content normal.        Judgment: Judgment normal.     Results for orders placed or performed in visit on 11/22/23  CBC with Differential/Platelet   Collection Time: 11/22/23  3:45 PM  Result Value Ref Range   WBC 5.4 3.4 - 10.8 x10E3/uL   RBC 4.46 3.77 - 5.28 x10E6/uL   Hemoglobin 12.9 11.1 - 15.9 g/dL   Hematocrit 59.8 65.9 - 46.6 %   MCV 90 79 - 97 fL   MCH 28.9 26.6 - 33.0 pg   MCHC 32.2 31.5 - 35.7 g/dL   RDW 87.0 88.2 - 84.5 %   Platelets 233 150 - 450 x10E3/uL   Neutrophils 63 Not Estab. %   Lymphs 29 Not Estab. %   Monocytes 7 Not Estab. %   Eos 1 Not Estab. %   Basos 0 Not Estab. %   Neutrophils Absolute 3.4 1.4 - 7.0 x10E3/uL   Lymphocytes Absolute 1.5 0.7 - 3.1 x10E3/uL   Monocytes Absolute 0.4 0.1 - 0.9 x10E3/uL   EOS (ABSOLUTE) 0.1 0.0 - 0.4 x10E3/uL   Basophils Absolute 0.0 0.0 - 0.2 x10E3/uL   Immature Granulocytes 0 Not Estab. %   Immature Grans (Abs) 0.0 0.0 - 0.1 x10E3/uL  Comprehensive metabolic panel   Collection Time: 11/22/23  3:45 PM  Result Value Ref Range   Glucose 110 (H) 70 - 99 mg/dL   BUN 15 6 - 24 mg/dL   Creatinine, Ser 9.09 0.57 - 1.00 mg/dL   eGFR 75 >40 fO/fpw/8.26   BUN/Creatinine Ratio 17 9 - 23   Sodium 139 134 - 144 mmol/L   Potassium 3.9 3.5 - 5.2 mmol/L   Chloride 101 96 - 106 mmol/L   CO2 22 20 - 29 mmol/L   Calcium 9.6 8.7 - 10.2 mg/dL   Total Protein 6.8 6.0 - 8.5 g/dL   Albumin 4.1 3.8 - 4.9 g/dL   Globulin, Total 2.7 1.5 - 4.5 g/dL   Bilirubin Total 0.7 0.0 - 1.2 mg/dL   Alkaline Phosphatase 66 44 - 121 IU/L   AST 23 0 - 40 IU/L   ALT 20 0 - 32 IU/L  Lipid Panel w/o Chol/HDL Ratio   Collection Time: 11/22/23  3:45 PM  Result Value Ref Range   Cholesterol, Total 264 (H) 100 - 199 mg/dL   Triglycerides 759 (H) 0 - 149 mg/dL   HDL 44 >60 mg/dL   VLDL Cholesterol Cal 45 (H) 5 - 40 mg/dL   LDL Chol Calc (NIH) 824 (H) 0 - 99 mg/dL  TSH   Collection Time: 11/22/23  3:45 PM  Result Value Ref Range   TSH 1.350 0.450 - 4.500 uIU/mL      Assessment & Plan:   Problem List Items Addressed This Visit       Other   Syncope   Cardiac work up suggestive of vasovagal, no need for pacemaker. Follow up with Dr. Derwin as needed. Neurology had 1 visit and had sleep study- unable to see if she had EEGs or MRI. Will get her back into neurology. Await their input.       Other Visit Diagnoses       Routine general medical examination at a health care facility    -  Primary   Vaccines up to date/declined. Screening labs checked today. Pap done. Continue diet and exercise. Call with any concerns.   Relevant Orders   CBC with Differential/Platelet   Comprehensive metabolic panel with GFR   Lipid Panel w/o Chol/HDL Ratio   TSH   Hepatitis B surface antibody,quantitative   Cytology - PAP     Encounter for screening mammogram for malignant neoplasm of breast       Mammo ordered today.   Relevant Orders   MM 3D SCREENING MAMMOGRAM BILATERAL BREAST     Needs flu shot       Flu shot given today.   Relevant Orders   Flu vaccine trivalent PF, 6mos and older(Flulaval,Afluria,Fluarix,Fluzone) (Completed)        Follow up plan: Return in about 6 months (around 05/24/2025).   LABORATORY TESTING:  - Pap smear: pap done  IMMUNIZATIONS:   - Tdap: Tetanus vaccination status reviewed: last tetanus booster within 10 years. - Influenza: Administered today - Prevnar: will return for - COVID: Refused - HPV: Not applicable - Shingrix vaccine: Refused  SCREENING: -Mammogram: Up to date  - Colonoscopy: Up to date   PATIENT COUNSELING:   Advised to take 1 mg of folate supplement per day if capable of pregnancy.   Sexuality: Discussed sexually transmitted diseases, partner selection, use of condoms, avoidance of unintended pregnancy  and contraceptive alternatives.   Advised to avoid cigarette smoking.  I discussed with the patient that most people either abstain  from alcohol or drink within safe limits (<=14/week and <=4 drinks/occasion for males, <=7/weeks and <= 3 drinks/occasion for females) and that the risk for alcohol disorders and other health effects rises proportionally with the number of drinks per week and how often a drinker exceeds daily limits.  Discussed cessation/primary prevention of drug use and availability of treatment for abuse.   Diet: Encouraged to adjust caloric intake to maintain  or achieve ideal body weight, to reduce intake of dietary saturated fat and total fat, to limit sodium intake by avoiding high sodium foods and not adding table salt, and to maintain adequate dietary potassium and calcium preferably from fresh fruits, vegetables, and low-fat dairy products.    stressed the importance of regular exercise  Injury prevention: Discussed safety belts, safety helmets, smoke detector, smoking near bedding or upholstery.   Dental health: Discussed importance of regular tooth brushing, flossing, and dental visits.    NEXT PREVENTATIVE PHYSICAL DUE IN 1 YEAR. Return in about 6 months (around 05/24/2025).              [1]  Allergies Allergen Reactions   Codeine Nausea And Vomiting and Other (See Comments)  [2]  Social History Tobacco Use  Smoking Status Never   Passive exposure: Past  Smokeless Tobacco Never

## 2024-11-23 NOTE — Assessment & Plan Note (Signed)
 Cardiac work up suggestive of vasovagal, no need for pacemaker. Follow up with Dr. Derwin as needed. Neurology had 1 visit and had sleep study- unable to see if she had EEGs or MRI. Will get her back into neurology. Await their input.

## 2024-11-24 ENCOUNTER — Ambulatory Visit: Payer: Self-pay | Admitting: Family Medicine

## 2024-11-24 DIAGNOSIS — E7849 Other hyperlipidemia: Secondary | ICD-10-CM | POA: Insufficient documentation

## 2024-11-24 LAB — CBC WITH DIFFERENTIAL/PLATELET
Basophils Absolute: 0 x10E3/uL (ref 0.0–0.2)
Basos: 1 %
EOS (ABSOLUTE): 0.1 x10E3/uL (ref 0.0–0.4)
Eos: 2 %
Hematocrit: 41.4 % (ref 34.0–46.6)
Hemoglobin: 13.8 g/dL (ref 11.1–15.9)
Immature Grans (Abs): 0 x10E3/uL (ref 0.0–0.1)
Immature Granulocytes: 0 %
Lymphocytes Absolute: 2.1 x10E3/uL (ref 0.7–3.1)
Lymphs: 35 %
MCH: 30.1 pg (ref 26.6–33.0)
MCHC: 33.3 g/dL (ref 31.5–35.7)
MCV: 90 fL (ref 79–97)
Monocytes Absolute: 0.5 x10E3/uL (ref 0.1–0.9)
Monocytes: 8 %
Neutrophils Absolute: 3.3 x10E3/uL (ref 1.4–7.0)
Neutrophils: 54 %
Platelets: 269 x10E3/uL (ref 150–450)
RBC: 4.59 x10E6/uL (ref 3.77–5.28)
RDW: 13.1 % (ref 11.7–15.4)
WBC: 6 x10E3/uL (ref 3.4–10.8)

## 2024-11-24 LAB — COMPREHENSIVE METABOLIC PANEL WITH GFR
ALT: 21 IU/L (ref 0–32)
AST: 22 IU/L (ref 0–40)
Albumin: 4.4 g/dL (ref 3.8–4.9)
Alkaline Phosphatase: 76 IU/L (ref 49–135)
BUN/Creatinine Ratio: 19 (ref 9–23)
BUN: 16 mg/dL (ref 6–24)
Bilirubin Total: 0.8 mg/dL (ref 0.0–1.2)
CO2: 24 mmol/L (ref 20–29)
Calcium: 9.9 mg/dL (ref 8.7–10.2)
Chloride: 99 mmol/L (ref 96–106)
Creatinine, Ser: 0.86 mg/dL (ref 0.57–1.00)
Globulin, Total: 2.6 g/dL (ref 1.5–4.5)
Glucose: 83 mg/dL (ref 70–99)
Potassium: 4.3 mmol/L (ref 3.5–5.2)
Sodium: 137 mmol/L (ref 134–144)
Total Protein: 7 g/dL (ref 6.0–8.5)
eGFR: 79 mL/min/1.73

## 2024-11-24 LAB — LIPID PANEL W/O CHOL/HDL RATIO
Cholesterol, Total: 293 mg/dL — ABNORMAL HIGH (ref 100–199)
HDL: 40 mg/dL
LDL Chol Calc (NIH): 178 mg/dL — ABNORMAL HIGH (ref 0–99)
Triglycerides: 382 mg/dL — ABNORMAL HIGH (ref 0–149)
VLDL Cholesterol Cal: 75 mg/dL — ABNORMAL HIGH (ref 5–40)

## 2024-11-24 LAB — HEPATITIS B SURFACE ANTIBODY, QUANTITATIVE: Hepatitis B Surf Ab Quant: 647 m[IU]/mL

## 2024-11-24 LAB — TSH: TSH: 1.83 u[IU]/mL (ref 0.450–4.500)

## 2024-11-27 LAB — CYTOLOGY - PAP
Comment: NEGATIVE
Diagnosis: NEGATIVE
High risk HPV: NEGATIVE

## 2024-11-28 NOTE — Addendum Note (Signed)
 Addended by: VICCI DUWAINE SQUIBB on: 11/28/2024 03:57 PM   Modules accepted: Orders

## 2025-01-19 ENCOUNTER — Encounter

## 2025-05-24 ENCOUNTER — Ambulatory Visit: Admitting: Family Medicine
